# Patient Record
Sex: Male | Born: 1940 | Race: White | Hispanic: No | Smoking: Former smoker
Health system: Southern US, Community
[De-identification: ages and names within clinical notes are randomized; demographics above are authoritative.]

## PROBLEM LIST (undated history)

## (undated) DIAGNOSIS — K449 Diaphragmatic hernia without obstruction or gangrene: Secondary | ICD-10-CM

## (undated) DIAGNOSIS — B019 Varicella without complication: Secondary | ICD-10-CM

## (undated) DIAGNOSIS — K219 Gastro-esophageal reflux disease without esophagitis: Secondary | ICD-10-CM

## (undated) DIAGNOSIS — N400 Enlarged prostate without lower urinary tract symptoms: Secondary | ICD-10-CM

## (undated) HISTORY — DX: Gastro-esophageal reflux disease without esophagitis: K21.9

## (undated) HISTORY — PX: CATARACT EXTRACTION: SUR2

## (undated) HISTORY — DX: Varicella without complication: B01.9

---

## 2005-01-15 ENCOUNTER — Emergency Department (HOSPITAL_COMMUNITY): Admission: EM | Admit: 2005-01-15 | Discharge: 2005-01-15 | Payer: Self-pay | Admitting: Emergency Medicine

## 2007-05-27 ENCOUNTER — Inpatient Hospital Stay (HOSPITAL_COMMUNITY): Admission: EM | Admit: 2007-05-27 | Discharge: 2007-05-31 | Payer: Self-pay | Admitting: Emergency Medicine

## 2007-05-27 ENCOUNTER — Ambulatory Visit: Payer: Self-pay | Admitting: *Deleted

## 2007-06-10 ENCOUNTER — Ambulatory Visit (HOSPITAL_COMMUNITY): Admission: RE | Admit: 2007-06-10 | Discharge: 2007-06-10 | Payer: Self-pay

## 2011-04-28 NOTE — Discharge Summary (Signed)
[name], [name] NO.:  [id number]   MEDICAL RECORD NO.:  [id number]          PATIENT TYPE:  INP   LOCATION:  5507                         FACILITY:  [facility]   PHYSICIAN:  [name], M.D.   DATE OF BIRTH:  12-12-1941   DATE OF ADMISSION:  05/26/2007  DATE OF DISCHARGE:  05/31/2007                               DISCHARGE SUMMARY   DISCHARGE DIAGNOSES:  1. Fever, likely secondary to right lower lobe pneumonia.  2. Right upper lobe cavitary lesion with history of pneumonia in past,      1985, acid-fast bacillus smears x2 negative.  3. Hypothyroidism, newly-diagnosed, TSH 37.05, free T4 0.70.  4. Mild transaminitis, negative ultrasound, negative HIDA scan.      Workup as an outpatient pending.  5. Chronic renal insufficiency.  6. Anemia secondary to B12 deficiency and anemia of chronic disease.  7. Poor dentition.  8. History of superficial thrombophlebitis in 2006.  9. History of pleurisy, recurrent, post history of pneumonia in 1985      with cavitary lesion.   DISCHARGE MEDICATIONS:  1. Folic acid 1 mg p.o. daily.  2. Synthroid 50 mcg p.o. daily.  3. Thiamine 100 mg p.o. daily.  4. Avelox 400 mg p.o. daily for 7 more days.  5. Protonix 40 mg p.o. daily.  6. Vitamin B12 1000 mcg each month IM.   DISPOSITION:  The patient has been discharged home in stable condition  to follow up with his primary care physician, Dr. Loma Larsen at  [facility], phone number [phone].  The patient will be  called with appointment.  At the time of follow-up, need to address the  following issues:   1. His clinical improvement post recent admission secondary to      pneumonia after finishing a complete course of Avelox 400 mg daily      for a total of 12 days.  2. Need to recheck his liver function panel, make sure it is stable or      trending down.  The patient had an ultrasound and HIDA scan, which      were negative during the hospitalization.   An acute hepatitis panel      was negative as well.  3. Need to repeat TSH and free T4 from 4-8 weeks from now since he has      been started on Synthroid therapy.  This is newly-diagnosed.  4. Need to recheck his B12 level.  He has been started on therapy 1000      mcg monthly dosage.  5. Also need to follow up on the right upper lobe cavitary lesion,      which seems to be chronic, secondary to his past history of      pneumonia.  He had AFB smears x2 negative during this      hospitalization.   CONSULTATIONS:  None.   PROCEDURES/IMAGING:  1. He had a CT scan of the chest with contrast on May 27, 2007.  This      showed:  (1) Background pattern of centrilobular emphysema, no  active process otherwise in the left lung.  (2) Pneumonia affecting      the superior segment of the right lower lobe.  (3) Chronic      bronchiectasis, consolidation and collapse affecting the right      upper lobe, 4 cm airspace at the right apex with some fluid      dependent within it, and I think this is probably parenchymal      airspace rather than pleural airspace.  2. Chest x-ray on May 26, 2007, showed chronic right upper lobe      process with cavitation.  Consider TB, atypical TB or fungal or      opportunistic process.  3. Ultrasound of the abdomen on May 28, 2007, showed gallbladder is      not well-distended with slightly prominent gallbladder wall but no      gallstones, no ductal dilatation.  Pancreas obscured by bowel gas.  4. HIDA scan done on May 31, 2007, showed normal exam with normal      gallbladder ejection fraction.   HISTORY OF PRESENT ILLNESS:  [name] is a 70 year old Caucasian man  with past history significant for superficial thrombophlebitis in 2006,  history of pneumonia in 1985 with subsequent cavitary lesion evaluated  by Dr. __________ in [facility], and repeated pleurisies, per patient.  Presented on the day of admission to the ED with a 4-day history of   fever with a T-max of 102 degrees Fahrenheit, diaphoresis, frontal  headache, and jaw pain bilaterally and some chest pain in right lower  lobe and in corresponding back area.  He also mentioned the chest pain  is similar to the pleurisy pain that he used to get frequently post his  pneumonia in 1985.  He also had a PPD placed at that time in 1985, which  was negative.   PHYSICAL EXAMINATION:  VITAL SIGNS:  Temperature 101.9, blood pressure  135/74, pulse 110, respiratory rate 20, O2 saturation 94% on room air.  GENERAL APPEARANCE:  He was in no acute distress.  Family in the room.  HEENT:  Eyes:  Pupils were equal, reactive.  Extraocular muscles intact.  He is anicteric.  No papilledema.  ENT:  Clear oropharynx, moist mucous  membranes, no thrush.  NECK:  Supple, no thyromegaly, no JVD.  No neck stiffness.  RESPIRATIONS:  Clear to auscultation in right lung.  Decreased breath  sounds in the left upper lobe and decreased tactile fremitus in the left  lower lobe.  No crackles.  CARDIOVASCULAR:  Regular rate and rhythm, no murmurs, gallops or rubs.  Good pedal pulses.  ABDOMEN:  Soft, nontender, nondistended.  Positive bowel sounds.  EXTREMITIES:  No edema.  Left leg with tortuous veins.  No increased  warmth or erythema noticed.  BACK:  No CVA tenderness.  SKIN:  Moist, no rashes.  MUSCULOSKELETAL:  Within normal limits.  NEUROLOGIC:  Intact.  Cranial nerves II-XII intact.  Muscle strength 5/5  in all extremities.  Romberg negative.  Normal gait.  Cerebellar signs  within normal limits.   ADMISSION LABORATORY DATA:  CBC with hemoglobin 13.6 and white count  8.3, thrombocytes 190, MCV 87, ANC 63.  BMET revealed sodium 132,  potassium 4.1, chloride 100, bicarb 23, BUN 10, creatinine 1.31, and  glucose 133.  Urinalysis negative.  Blood cultures sent.  Chest x-ray as  mentioned above.   HOSPITAL COURSE:  Problem 1.  FEVER MOST LIKELY SECONDARY TO RIGHT LOWER LOBE PNEUMONIA:  At the  time of admission though with a history of his chronic cavitary  lesion on the right upper lobe which was seen on the chest x-ray, we  were concerned about variable differential diagnosis, including  tuberculosis, neoplasm, versus atypical or fungal infection or anaerobic  infection.  He was placed in isolation and had respiratory cultures,  which were negative.  He also had sputum for AFB smear, which was  negative x3, and culture pending at this time.  CT of the chest with  contrast was done to further assess the right upper lobe cavitary lesion  as well.  This showed chronic changes as mentioned above, including  chronic bronchiectasis, consolidation and collapse affecting the right  upper lobe, 4 cm airspace with some fluid dependent within it, probably  pulmonary parenchymal airspace rather than pleural airspace.  He was  placed on Avelox for empiric therapy of community-acquired pneumonia  over the right upper lobe and over time he clinically improved very much  and remained afebrile throughout the hospitalization.  He was taken out  of the isolation after two AFB smears were negative and the third one  was negative as well, and the culture is pending at this point.  He will  follow for this with his primary care physician.  If his symptoms were  to return in future, he needs more aggressive evaluation, possibly a  bronchoscopy and diagnostic evaluation as well.  Note, he also had a PPD  placed in 1985, which was negative as well.   We also requested the patient to have an HIV test but he decided not to  have the test per his wishes.   Problem 2.  RIGHT UPPER LOBE CHRONIC LUNG LESION:  Most likely this is a  consequence of previous pneumonia and chronic changes.  CT scan of the  chest was done, as mentioned above, which showed chronic changes of  bronchiectasis, consolidation and collapse affecting this area, with a 4  cm airspace at the right apex.  Given the fact that he had AFB  smears x3  negative as well as he improved clinically after treatment of the right  lower lobe pneumonia, we did not go further for evaluating this lesion  with the impression that this is a chronic change; but in case his  symptoms were to return or he develops new symptoms in future, this  needs to be further worked up.   Problem 3.  NEWLY-DIAGNOSED HYPOTHYROIDISM:  He had a TSH checked, which  was low at 37.015, and we did a free T4 level, which was low at 0.70.  He was started on 50 mcg of Synthroid while in hospital and discharged  on same.   Problem 4.  MILD TRANSAMINITIS:  Liver function tests at the time of  admission were slightly elevated with AST of 52, ALT of 44, which  started trending down at one point to AST of 42 but prior to day of  discharge, they did re-trend back up, raising the alkaline phosphatase  level as well to 147, AST up to 64 and ALT up to 65, and further to AST  of 79 and ALT of 78 but remained stable at the time of discharge at AST  of 79 and alkaline phosphatase of 167.  For workup of this in regard to  workup, he had an acute hepatitis panel which was negative and he also  had an ultrasound of the liver, which showed gallbladder not well-  distended with slightly prominent gallbladder  wall, no gallstones, no  ductal dilatation.  With the concern of persistent elevation, we did  check a HIDA scan, which was negative for any gallbladder outflow  obstruction, and the patient did not have any signs of acute  cholecystitis or cholestasis.  He also mentioned being followed up by a  gastroenterologist in the past with a questioned history of hepatitis  and would like to follow up with him as an outpatient.  The patient will  follow with his primary care physician and gastroenterology as an  outpatient as well for further management of this problem as well.  We  also held Tylenol, which was placed for p.r.n. basis, given his abnormal  liver function tests.    Problem 5.  ANEMIA, MULTIFACTORIAL:  He does have signs of iron  deficiency with low iron level of 26, TIBC normal at 259, and percentage  saturation low at 10, but a ferritin level of 415 shows this is probably  unlikely secondary to iron deficiency alone.  He also had B12 deficiency  with a level of 186.  Methylmalonic acid level has been checked.  It is  pending at this time.  He has been started on therapy with B12 and was  given a shot of 1000 mcg IM x1 dose and needs to be placed on monthly  therapy from now and B12 level needs to be checked later on in future to  make sure he is repleted.  His folate level was checked as well, which  was normal at 1075.   His hemoglobin level did remain stable at the time of discharge at 12.3.  From admission, hemoglobin was 12.8 and 13.1.  Anti-intrinsic factor  antibody factor was also checked, which is pending at this time as well.   Problem 6.  CHRONIC RENAL INSUFFICIENCY:  His creatinine level at the  time of admission was 1.42, which remained stable during the  hospitalization, and his discharge creatinine was stable at 1.43.  Again, this needs to be further monitored closely as an outpatient.   DISCHARGE LABORATORY DATA:  CBC with hemoglobin 12.3, white count 6.3,  platelets 256.  CMP with sodium 139, potassium 4.0, chloride 103, bicarb  27, glucose 115, BUN 14, creatinine 1.43.  His AST is 79, ALT 91,  alkaline phosphatase 167.  Total protein 6.7, albumin 2.9, calcium 9.0.   DISCHARGE VITAL SIGNS:  At the time of discharge his vital signs show a  temperature of 97.2, pulse 87, respiratory rate 16, blood pressure  124/74, O2 saturations were 94% on room air.      Judie Grieve, MD  Electronically Signed      [name], M.D.  Electronically Signed    SY/MEDQ  D:  06/01/2007  T:  06/01/2007  Job:  [id number]   cc:   Shawn Sender, MD

## 2011-05-04 ENCOUNTER — Emergency Department (HOSPITAL_COMMUNITY)
Admission: EM | Admit: 2011-05-04 | Discharge: 2011-05-04 | Disposition: A | Discharge status: Home / Self Care | Payer: Medicare Other | Attending: Emergency Medicine | Admitting: Emergency Medicine

## 2011-05-04 DIAGNOSIS — R339 Retention of urine, unspecified: Secondary | ICD-10-CM | POA: Insufficient documentation

## 2011-05-04 LAB — URINALYSIS, ROUTINE W REFLEX MICROSCOPIC
Bilirubin Urine: NEGATIVE
Leukocytes, UA: NEGATIVE
Protein, ur: NEGATIVE mg/dL
Urobilinogen, UA: 0.2 mg/dL (ref 0.0–1.0)

## 2011-05-04 LAB — URINE MICROSCOPIC-ADD ON

## 2011-06-13 ENCOUNTER — Emergency Department (HOSPITAL_COMMUNITY)
Admission: EM | Admit: 2011-06-13 | Discharge: 2011-06-13 | Disposition: A | Discharge status: Home / Self Care | Payer: Medicare Other | Attending: Emergency Medicine | Admitting: Emergency Medicine

## 2011-06-13 DIAGNOSIS — R109 Unspecified abdominal pain: Secondary | ICD-10-CM | POA: Insufficient documentation

## 2011-06-13 DIAGNOSIS — R339 Retention of urine, unspecified: Secondary | ICD-10-CM | POA: Insufficient documentation

## 2011-06-13 DIAGNOSIS — R141 Gas pain: Secondary | ICD-10-CM | POA: Insufficient documentation

## 2011-06-13 DIAGNOSIS — R142 Eructation: Secondary | ICD-10-CM | POA: Insufficient documentation

## 2011-06-13 LAB — URINALYSIS, ROUTINE W REFLEX MICROSCOPIC
Bilirubin Urine: NEGATIVE
Glucose, UA: NEGATIVE mg/dL
Hgb urine dipstick: NEGATIVE
Ketones, ur: NEGATIVE mg/dL
Protein, ur: NEGATIVE mg/dL
pH: 6 (ref 5.0–8.0)

## 2011-06-13 LAB — POCT I-STAT, CHEM 8
BUN: 8 mg/dL (ref 6–23)
Calcium, Ion: 1.23 mmol/L (ref 1.12–1.32)
Creatinine, Ser: 1.4 mg/dL — ABNORMAL HIGH (ref 0.50–1.35)
Glucose, Bld: 105 mg/dL — ABNORMAL HIGH (ref 70–99)
Sodium: 142 mEq/L (ref 135–145)
TCO2: 25 mmol/L (ref 0–100)

## 2011-09-30 LAB — COMPREHENSIVE METABOLIC PANEL
ALT: 65 — ABNORMAL HIGH
AST: 64 — ABNORMAL HIGH
Albumin: 2.9 — ABNORMAL LOW
Albumin: 3.2 — ABNORMAL LOW
Alkaline Phosphatase: 147 — ABNORMAL HIGH
Alkaline Phosphatase: 169 — ABNORMAL HIGH
BUN: 11
BUN: 14
CO2: 25
Calcium: 9
Creatinine, Ser: 1.41
Creatinine, Ser: 1.43
GFR calc Af Amer: >60
Glucose, Bld: 93
Potassium: 3.7
Potassium: 4
Sodium: 138
Total Bilirubin: 1
Total Bilirubin: 1.1
Total Protein: 6.7
Total Protein: 7.3

## 2011-09-30 LAB — CBC
HCT: 35.7 — ABNORMAL LOW
Hemoglobin: 12.3 — ABNORMAL LOW
MCHC: 33.4
MCHC: 33.5
MCHC: 33.7
MCV: 87.8
Platelets: 256
RBC: 4.13 — ABNORMAL LOW
RBC: 4.17 — ABNORMAL LOW
WBC: 6.3

## 2011-09-30 LAB — BASIC METABOLIC PANEL
BUN: 10
Calcium: 8.6
Chloride: 106
Creatinine, Ser: 1.45
GFR calc non Af Amer: 49 — ABNORMAL LOW
Sodium: 140

## 2011-09-30 LAB — RETICULOCYTES
Retic Count, Absolute: 34.2
Retic Ct Pct: 0.8

## 2011-09-30 LAB — AFB CULTURE WITH SMEAR (NOT AT ARMC)

## 2011-09-30 LAB — IRON AND TIBC
Iron: 26 — ABNORMAL LOW
TIBC: 259

## 2011-09-30 LAB — FOLATE: Folate: >20

## 2011-09-30 LAB — FOLATE RBC: RBC Folate: 1075 — ABNORMAL HIGH

## 2011-09-30 LAB — OCCULT BLOOD X 1 CARD TO LAB, STOOL: Fecal Occult Bld: NEGATIVE

## 2011-10-01 LAB — COMPREHENSIVE METABOLIC PANEL
ALT: 41
AST: 42 — ABNORMAL HIGH
Albumin: 3 — ABNORMAL LOW
Alkaline Phosphatase: 87
CO2: 25
Chloride: 107
GFR calc Af Amer: >60
GFR calc non Af Amer: 55 — ABNORMAL LOW
Potassium: 4
Sodium: 137
Total Bilirubin: 1.7 — ABNORMAL HIGH

## 2011-10-01 LAB — URINALYSIS, ROUTINE W REFLEX MICROSCOPIC
Glucose, UA: NEGATIVE
Hgb urine dipstick: NEGATIVE
Specific Gravity, Urine: 1.022
pH: 6.5

## 2011-10-01 LAB — APTT: aPTT: 38 — ABNORMAL HIGH

## 2011-10-01 LAB — CULTURE, BLOOD (ROUTINE X 2)
Culture: NO GROWTH
Culture: NO GROWTH

## 2011-10-01 LAB — URINE MICROSCOPIC-ADD ON

## 2011-10-01 LAB — CULTURE, RESPIRATORY W GRAM STAIN
Culture: NORMAL
Gram Stain: NONE SEEN

## 2011-10-01 LAB — CBC
HCT: 37.8 — ABNORMAL LOW
HCT: 39
Hemoglobin: 12.8 — ABNORMAL LOW
Hemoglobin: 13.1
Hemoglobin: 13.6
MCHC: 33.7
MCV: 87.1
MCV: 87.3
Platelets: 177
RBC: 4.33
RBC: 4.48
RBC: 4.68
RDW: 13.8
RDW: 14.1 — ABNORMAL HIGH
WBC: 6.4
WBC: 6.9

## 2011-10-01 LAB — BASIC METABOLIC PANEL WITH GFR
BUN: 10
CO2: 23
Calcium: 9.2
Chloride: 100
Creatinine, Ser: 1.31
GFR calc Af Amer: >60
GFR calc non Af Amer: 55 — ABNORMAL LOW
Glucose, Bld: 133 — ABNORMAL HIGH
Potassium: 4.1
Sodium: 132 — ABNORMAL LOW

## 2011-10-01 LAB — PROTIME-INR
INR: 1.1
Prothrombin Time: 14.9

## 2011-10-01 LAB — HEPATIC FUNCTION PANEL
ALT: 44
AST: 52 — ABNORMAL HIGH
Alkaline Phosphatase: 90
Indirect Bilirubin: 2.3 — ABNORMAL HIGH
Total Protein: 7.5

## 2011-10-01 LAB — ACID-FAST (MYCOBACTERIA) SMEAR AND CULTURE WITH REFLEX TO IDENTIFICATION: Acid Fast Smear: NONE SEEN

## 2011-10-01 LAB — DIFFERENTIAL
Basophils Absolute: 0
Lymphocytes Relative: 12
Monocytes Absolute: 0.9 — ABNORMAL HIGH
Monocytes Relative: 11
Neutro Abs: 6.3
Neutrophils Relative %: 76

## 2011-10-01 LAB — HEPATITIS PANEL, ACUTE
HCV Ab: NEGATIVE
Hep A IgM: NEGATIVE

## 2011-10-01 LAB — TSH: TSH: 37.015 — ABNORMAL HIGH

## 2011-10-01 LAB — EXPECTORATED SPUTUM ASSESSMENT W GRAM STAIN, RFLX TO RESP C

## 2011-10-01 LAB — BASIC METABOLIC PANEL
Chloride: 103
GFR calc Af Amer: >60
GFR calc non Af Amer: 50 — ABNORMAL LOW
Potassium: 4
Sodium: 139

## 2011-10-01 LAB — AFB CULTURE WITH SMEAR (NOT AT ARMC)

## 2011-10-01 LAB — MAGNESIUM: Magnesium: 2.5

## 2012-12-17 ENCOUNTER — Encounter (HOSPITAL_COMMUNITY): Payer: Self-pay | Admitting: Nurse Practitioner

## 2012-12-17 ENCOUNTER — Emergency Department (HOSPITAL_COMMUNITY)
Admission: EM | Admit: 2012-12-17 | Discharge: 2012-12-18 | Disposition: A | Discharge status: Home / Self Care | Payer: Medicare Other | Attending: Emergency Medicine | Admitting: Emergency Medicine

## 2012-12-17 DIAGNOSIS — R109 Unspecified abdominal pain: Secondary | ICD-10-CM | POA: Insufficient documentation

## 2012-12-17 DIAGNOSIS — R339 Retention of urine, unspecified: Secondary | ICD-10-CM | POA: Insufficient documentation

## 2012-12-17 DIAGNOSIS — Z87891 Personal history of nicotine dependence: Secondary | ICD-10-CM | POA: Insufficient documentation

## 2012-12-17 DIAGNOSIS — N400 Enlarged prostate without lower urinary tract symptoms: Secondary | ICD-10-CM | POA: Insufficient documentation

## 2012-12-17 DIAGNOSIS — R300 Dysuria: Secondary | ICD-10-CM | POA: Insufficient documentation

## 2012-12-17 LAB — URINALYSIS, MICROSCOPIC ONLY
Bilirubin Urine: NEGATIVE
Hgb urine dipstick: NEGATIVE
Nitrite: NEGATIVE
Specific Gravity, Urine: 1.013 (ref 1.005–1.030)
Urobilinogen, UA: 0.2 mg/dL (ref 0.0–1.0)
pH: 5.5 (ref 5.0–8.0)

## 2012-12-17 LAB — BASIC METABOLIC PANEL
Calcium: 9.7 mg/dL (ref 8.4–10.5)
Creatinine, Ser: 1.28 mg/dL (ref 0.50–1.35)
GFR calc Af Amer: 62 mL/min — ABNORMAL LOW (ref >90)
Sodium: 133 mEq/L — ABNORMAL LOW (ref 135–145)

## 2012-12-17 MED ORDER — LIDOCAINE HCL 2 % EX GEL
Freq: Once | CUTANEOUS | Status: DC
  Filled 2012-12-17: qty 20

## 2012-12-17 NOTE — ED Provider Notes (Signed)
History     CSN: [id number]  Arrival date & time 12/17/12  7829   First MD Initiated Contact with Patient 12/17/12 2139      Chief Complaint  Patient presents with  . Urinary Retention    (Consider location/radiation/quality/duration/timing/severity/associated sxs/prior treatment) Patient is a 72 y.o. male presenting with general illness. The history is provided by the patient. No language interpreter was used.  Illness  The current episode started today. The problem occurs continuously. The problem has been unchanged. The problem is moderate. Nothing relieves the symptoms. Nothing aggravates the symptoms. Associated symptoms include abdominal pain. Pertinent negatives include no fever, no constipation, no diarrhea, no nausea, no vomiting, no congestion, no headaches, no sore throat, no cough and no rash.    History reviewed. No pertinent past medical history.  History reviewed. No pertinent past surgical history.  History reviewed. No pertinent family history.  History  Substance Use Topics  . Smoking status: Former Games developer  . Smokeless tobacco: Not on file  . Alcohol Use: No      Review of Systems  Constitutional: Negative for fever and chills.  HENT: Negative for congestion and sore throat.   Respiratory: Negative for cough and shortness of breath.   Cardiovascular: Negative for chest pain and leg swelling.  Gastrointestinal: Positive for abdominal pain. Negative for nausea, vomiting, diarrhea and constipation.  Genitourinary: Positive for decreased urine volume and difficulty urinating. Negative for dysuria and frequency.  Skin: Negative for color change and rash.  Neurological: Negative for dizziness and headaches.  Psychiatric/Behavioral: Negative for confusion and agitation.  All other systems reviewed and are negative.    Allergies  Review of patient's allergies indicates no known allergies.  Home Medications  No current outpatient prescriptions on  file.  BP 143/79  Pulse 115  Temp 98 F (36.7 C) (Oral)  Resp 24  SpO2 98%  Physical Exam  Constitutional: He is oriented to person, place, and time. He appears well-developed and well-nourished. No distress.  HENT:  Head: Normocephalic and atraumatic.  Eyes: EOM are normal. Pupils are equal, round, and reactive to light.  Neck: Normal range of motion. Neck supple.  Cardiovascular: Normal rate and regular rhythm.   Pulmonary/Chest: Effort normal. No respiratory distress.  Abdominal: Soft. He exhibits distension. There is tenderness in the suprapubic area. There is no rigidity, no rebound, no guarding, no CVA tenderness, no tenderness at McBurney's point and negative Murphy's sign. A hernia is present. Hernia confirmed positive in the ventral area.    Genitourinary: Prostate is enlarged. Prostate is not tender.  Musculoskeletal: Normal range of motion. He exhibits no edema.  Neurological: He is alert and oriented to person, place, and time.  Skin: Skin is warm and dry.  Psychiatric: He has a normal mood and affect. His behavior is normal.    ED Course  Procedures (including critical care time)  Labs Reviewed - No data to display No results found. Results for orders placed during the hospital encounter of 12/17/12  URINALYSIS, MICROSCOPIC ONLY      Component Value Range   Color, Urine YELLOW  YELLOW   APPearance CLEAR  CLEAR   Specific Gravity, Urine 1.013  1.005 - 1.030   pH 5.5  5.0 - 8.0   Glucose, UA NEGATIVE  NEGATIVE mg/dL   Hgb urine dipstick NEGATIVE  NEGATIVE   Bilirubin Urine NEGATIVE  NEGATIVE   Ketones, ur NEGATIVE  NEGATIVE mg/dL   Protein, ur NEGATIVE  NEGATIVE mg/dL   Urobilinogen, UA 0.2  0.0 - 1.0 mg/dL   Nitrite NEGATIVE  NEGATIVE   Leukocytes, UA NEGATIVE  NEGATIVE   WBC, UA 0-2  <3 WBC/hpf   RBC / HPF 0-2  <3 RBC/hpf  BASIC METABOLIC PANEL      Component Value Range   Sodium 133 (*) 135 - 145 mEq/L   Potassium 3.9  3.5 - 5.1 mEq/L   Chloride 97   96 - 112 mEq/L   CO2 23  19 - 32 mEq/L   Glucose, Bld 102 (*) 70 - 99 mg/dL   BUN 12  6 - 23 mg/dL   Creatinine, Ser 4.09  0.50 - 1.35 mg/dL   Calcium 9.7  8.4 - 81.1 mg/dL   GFR calc non Af Amer 54 (*) >90 mL/min   GFR calc Af Amer 62 (*) >90 mL/min  GRAM STAIN      Component Value Range   Specimen Description URINE, CATHETERIZED     Special Requests NONE     Gram Stain       Value: CYTOSPUN URINE     NO ORGANISMS SEEN     NO WBC SEEN   Report Status 12/18/2012 FINAL       No diagnosis found.    MDM  Pt w/ Hx of BPH presents to ED for acute urinary retention. Has not urinated since 0900 today. C/o abdominal pain and distension. Denies fever or flank pain. No hematuria. Has been seen by urology in the past, is not currently on meds for BPH  Exam: vitals unremarkable. abd distended, bladder palpable at level of umbilicus. Ttp, prostate grossly enlarged, nttp.   Plan: likely acute obstructive urinary retention 2/2 BPH. Will insert foley cath, check u/a, bmp  Course: reassessed, vitals stable, foley placed w/out event, u/a neg for infection, Cr 1.28. No ARF or infection at this time. Stable for d/c home. Given leg bag and instructions to follow up w/ urology on Monday. Given return precautions. D/c in good condition.  1. Urinary retention   2. BPH (benign prostatic hyperplasia)    [facility] [address] [place] [address] [phone] Schedule an appointment as soon as possible for a visit on 12/19/2012         Audelia Hives, MD 12/18/12 [phone]

## 2012-12-17 NOTE — ED Notes (Signed)
Pt states unable to void since 0900 today, when he was able to void a small amount. Reports he has been attempting to void but "i only get 1 or 2 drops out."

## 2012-12-17 NOTE — ED Provider Notes (Addendum)
Patient unable to urinate since morning. Feels much improved since treatment with Foley catheter in the emergency department. Patient to go home with indwelling Foley catheter  Doug Sou, MD 12/17/12 2226  Doug Sou, MD 12/18/12 1610

## 2012-12-17 NOTE — ED Notes (Signed)
Pt seen by EDP, prior to RN assessment, see MD notes.

## 2012-12-18 LAB — GRAM STAIN

## 2012-12-18 NOTE — ED Provider Notes (Signed)
I have personally seen and examined the patient.  I have discussed the plan of care with the resident.  I have reviewed the documentation on PMH/FH/Soc. History.  I have reviewed the documentation of the resident and agree.  Doug Sou, MD 12/18/12 [phone]

## 2014-06-30 ENCOUNTER — Encounter (HOSPITAL_COMMUNITY): Payer: Self-pay | Admitting: Emergency Medicine

## 2014-06-30 ENCOUNTER — Emergency Department (HOSPITAL_COMMUNITY)
Admission: EM | Admit: 2014-06-30 | Discharge: 2014-06-30 | Disposition: A | Discharge status: Home / Self Care | Payer: Medicare Other | Attending: Emergency Medicine | Admitting: Emergency Medicine

## 2014-06-30 DIAGNOSIS — R34 Anuria and oliguria: Secondary | ICD-10-CM | POA: Insufficient documentation

## 2014-06-30 DIAGNOSIS — R143 Flatulence: Secondary | ICD-10-CM | POA: Diagnosis not present

## 2014-06-30 DIAGNOSIS — R339 Retention of urine, unspecified: Secondary | ICD-10-CM | POA: Diagnosis present

## 2014-06-30 DIAGNOSIS — R1033 Periumbilical pain: Secondary | ICD-10-CM | POA: Insufficient documentation

## 2014-06-30 DIAGNOSIS — K6289 Other specified diseases of anus and rectum: Secondary | ICD-10-CM | POA: Insufficient documentation

## 2014-06-30 DIAGNOSIS — R141 Gas pain: Secondary | ICD-10-CM | POA: Diagnosis not present

## 2014-06-30 DIAGNOSIS — R300 Dysuria: Secondary | ICD-10-CM | POA: Diagnosis not present

## 2014-06-30 DIAGNOSIS — R3915 Urgency of urination: Secondary | ICD-10-CM | POA: Insufficient documentation

## 2014-06-30 DIAGNOSIS — R142 Eructation: Secondary | ICD-10-CM

## 2014-06-30 HISTORY — DX: Benign prostatic hyperplasia without lower urinary tract symptoms: N40.0

## 2014-06-30 LAB — COMPREHENSIVE METABOLIC PANEL
ALK PHOS: 70 U/L (ref 39–117)
ALT: 13 U/L (ref 0–53)
ANION GAP: 19 — AB (ref 5–15)
AST: 20 U/L (ref 0–37)
Albumin: 4.1 g/dL (ref 3.5–5.2)
BILIRUBIN TOTAL: 1.1 mg/dL (ref 0.3–1.2)
BUN: 11 mg/dL (ref 6–23)
CHLORIDE: 98 meq/L (ref 96–112)
CO2: 21 mEq/L (ref 19–32)
Calcium: 9.2 mg/dL (ref 8.4–10.5)
Creatinine, Ser: 1.41 mg/dL — ABNORMAL HIGH (ref 0.50–1.35)
GFR calc non Af Amer: 47 mL/min — ABNORMAL LOW (ref >90)
GFR, EST AFRICAN AMERICAN: 55 mL/min — AB (ref >90)
GLUCOSE: 131 mg/dL — AB (ref 70–99)
POTASSIUM: 4.1 meq/L (ref 3.7–5.3)
SODIUM: 138 meq/L (ref 137–147)
Total Protein: 7.4 g/dL (ref 6.0–8.3)

## 2014-06-30 LAB — URINALYSIS, ROUTINE W REFLEX MICROSCOPIC
BILIRUBIN URINE: NEGATIVE
Glucose, UA: NEGATIVE mg/dL
KETONES UR: NEGATIVE mg/dL
Leukocytes, UA: NEGATIVE
NITRITE: NEGATIVE
PH: 7 (ref 5.0–8.0)
Protein, ur: 30 mg/dL — AB
Specific Gravity, Urine: 1.006 (ref 1.005–1.030)
UROBILINOGEN UA: 0.2 mg/dL (ref 0.0–1.0)

## 2014-06-30 LAB — URINE MICROSCOPIC-ADD ON

## 2014-06-30 LAB — CBC WITH DIFFERENTIAL/PLATELET
Basophils Absolute: 0 10*3/uL (ref 0.0–0.1)
Basophils Relative: 0 % (ref 0–1)
Eosinophils Absolute: 0 10*3/uL (ref 0.0–0.7)
Eosinophils Relative: 0 % (ref 0–5)
HCT: 45.7 % (ref 39.0–52.0)
HEMOGLOBIN: 14.9 g/dL (ref 13.0–17.0)
LYMPHS ABS: 0.5 10*3/uL — AB (ref 0.7–4.0)
LYMPHS PCT: 6 % — AB (ref 12–46)
MCH: 28.8 pg (ref 26.0–34.0)
MCHC: 32.6 g/dL (ref 30.0–36.0)
MCV: 88.4 fL (ref 78.0–100.0)
MONOS PCT: 7 % (ref 3–12)
Monocytes Absolute: 0.5 10*3/uL (ref 0.1–1.0)
NEUTROS ABS: 6.9 10*3/uL (ref 1.7–7.7)
NEUTROS PCT: 87 % — AB (ref 43–77)
Platelets: 132 10*3/uL — ABNORMAL LOW (ref 150–400)
RBC: 5.17 MIL/uL (ref 4.22–5.81)
RDW: 14.2 % (ref 11.5–15.5)
WBC: 7.9 10*3/uL (ref 4.0–10.5)

## 2014-06-30 MED ORDER — MORPHINE SULFATE 4 MG/ML IJ SOLN
4.0000 mg | Freq: Once | INTRAMUSCULAR | Status: AC
  Administered 2014-06-30: 4 mg via INTRAVENOUS
  Filled 2014-06-30: qty 1

## 2014-06-30 MED ORDER — TAMSULOSIN HCL 0.4 MG PO CAPS
0.4000 mg | ORAL_CAPSULE | Freq: Once | ORAL | Status: AC
  Administered 2014-06-30: 0.4 mg via ORAL
  Filled 2014-06-30: qty 1

## 2014-06-30 MED ORDER — TAMSULOSIN HCL 0.4 MG PO CAPS: 0.4000 mg | ORAL_CAPSULE | Freq: Every day | ORAL | Status: DC

## 2014-06-30 NOTE — Discharge Instructions (Signed)
Acute Urinary Retention  Acute urinary retention is when you are unable to pee (urinate). Acute urinary retention is common in older men. Prostates can get bigger, which blocks the flow of pee.   HOME CARE  · Drink enough fluids to keep your pee clear or pale yellow.  · If you are sent home with a tube that drains the bladder (catheter), there will be a drainage bag attached to it. There are two types of bags. One is big that you can wear at night without having to empty it. One is smaller and needs to be emptied more often.  ¨ Keep the drainage bag empty.  ¨ Keep the drainage bag lower than your catheter.  · Only take medicine as told by your doctor.  GET HELP IF:  · You have a low-grade fever.  · You have spasms or you are leaking pee when you have spasms.  GET HELP RIGHT AWAY IF:   · You have chills or a fever.  · Your catheter stops draining pee.  · Your catheter falls out.  · You have increased bleeding that does not stop after you have rested and increased the amount of fluids you had been drinking.  MAKE SURE YOU:   · Understand these instructions.  · Will watch your condition.  · Will get help right away if you are not doing well or get worse.  Document Released: 05/18/2008 Document Revised: 09/20/2013 Document Reviewed: 05/11/2013  [employer]® Patient Information ©2015 [employer]. This information is not intended to replace advice given to you by your health care provider. Make sure you discuss any questions you have with your health care provider.

## 2014-06-30 NOTE — ED Provider Notes (Signed)
CSN: [id number]     Arrival date & time 06/30/14  1324 History   First MD Initiated Contact with Patient 06/30/14 1332     Chief Complaint  Patient presents with  . Urinary Retention     (Consider location/radiation/quality/duration/timing/severity/associated sxs/prior Treatment) HPI Comments: [name] is a 73 y.o. Male with a PMHx of BPH and urinary retention presenting today with urinary retention since 0300. Pt states he awoke and had the urge to urinate, but has been unsuccessful. States he's developed suprapubic fullness and pressure, 10/10, constant, radiating into his perineum/rectum, with no known aggravating or alleviating factors, with associated abd distension. States this has occurred in the past and he had a catheter placed, which relieved his pain. Denies HA, vision changes, fevers/chills, CP, SOB, upper abd pain, N/V/D/C, back pain, flank pain, hematuria, dysuria, penile discharge or swelling, testicular pain or swelling. States he's been taking Flomax for 1 year and has not had any issues with it. No recent diet changes or medication changes.  Patient is a 73 y.o. male presenting with abdominal pain. The history is provided by the patient. No language interpreter was used.  Abdominal Pain Pain location:  Suprapubic Pain quality: fullness and pressure   Pain radiates to:  Perineum Pain severity:  Severe Onset quality:  Sudden Duration:  11 hours Timing:  Constant Progression:  Worsening Chronicity:  New Context: awakening from sleep   Relieved by:  Nothing Worsened by:  Nothing tried Ineffective treatments:  None tried Associated symptoms: no chest pain, no chills, no constipation, no cough, no diarrhea, no dysuria, no fatigue, no fever, no flatus, no hematemesis, no hematochezia, no hematuria, no melena, no nausea, no shortness of breath and no vomiting   Risk factors: being elderly     Past Medical History  Diagnosis Date  . Prostate enlargement    History  reviewed. No pertinent past surgical history. No family history on file. History  Substance Use Topics  . Smoking status: Former Games developer  . Smokeless tobacco: Not on file  . Alcohol Use: No    Review of Systems  Constitutional: Negative for fever, chills and fatigue.  Eyes: Negative for visual disturbance.  Respiratory: Negative for cough and shortness of breath.   Cardiovascular: Negative for chest pain and leg swelling.  Gastrointestinal: Positive for abdominal pain, abdominal distention and rectal pain (fullness). Negative for nausea, vomiting, diarrhea, constipation, blood in stool, melena, hematochezia, anal bleeding, flatus and hematemesis.  Genitourinary: Positive for decreased urine volume and difficulty urinating. Negative for dysuria, urgency, frequency, hematuria, flank pain, discharge, penile swelling, scrotal swelling, penile pain and testicular pain.  Musculoskeletal: Negative for back pain, myalgias, neck pain and neck stiffness.  Skin: Negative for rash.  Neurological: Negative for dizziness, syncope, weakness and light-headedness.  Psychiatric/Behavioral: Negative for confusion.  10 Systems reviewed and are negative for acute change except as noted in the HPI.     Allergies  Review of patient's allergies indicates no known allergies.  Home Medications   Prior to Admission medications   Medication Sig Start Date End Date Taking? Authorizing Provider  tamsulosin (FLOMAX) 0.4 MG CAPS capsule Take 0.4 mg by mouth daily. 05/28/14  Yes Historical Provider, MD   BP 171/106  Pulse 91  Temp(Src) 97.8 F (36.6 C)  Resp 20  Ht 6\' 1"  (1.854 m)  Wt 210 lb (95.255 kg)  BMI 27.71 kg/m2  SpO2 96% Physical Exam  Nursing note and vitals reviewed. Constitutional: He is oriented to person, place, and  time. He appears well-developed and well-nourished. He appears distressed.  Hypertensive at 170s/100s, visibly distressed and uncomfortable. Non-toxic, afebrile  HENT:  Head:  Normocephalic and atraumatic.  Mouth/Throat: Oropharynx is clear and moist. Mucous membranes are dry.  Mucous membranes dry  Eyes: Conjunctivae and EOM are normal. Pupils are equal, round, and reactive to light. Right eye exhibits no discharge. Left eye exhibits no discharge.  Neck: Normal range of motion. Neck supple.  Cardiovascular: Normal rate, regular rhythm, normal heart sounds and intact distal pulses.  Exam reveals no gallop.   No murmur heard. Pulmonary/Chest: Effort normal and breath sounds normal. No respiratory distress. He has no decreased breath sounds. He has no wheezes. He has no rales.  Abdominal: Soft. Bowel sounds are normal. He exhibits distension. He exhibits no fluid wave. There is tenderness in the periumbilical area and suprapubic area. There is no rigidity, no rebound, no guarding and no CVA tenderness. Hernia confirmed negative in the right inguinal area and confirmed negative in the left inguinal area.  Soft, +BS throughout. TTP in suprapubic region up to periumbilical region, with moderate distension noted suprapubically. Dullness to percussion over distension with no tympany in other quadrants. No rebound, guarding, rigidity, no CVA TTP.   Genitourinary: Testes normal and penis normal. Cremasteric reflex is present.  Circumcised penis with red blood at external urethral meatus, which began after two cath attempts while here, prior to exam. Testes WNL with no TTP, neg Prehn's, +cremasteric reflex bilaterally, no high-riding, and no swelling. B/L inguinal canals free of hernias  Musculoskeletal: Normal range of motion.  Neurological: He is alert and oriented to person, place, and time.  Skin: Skin is warm, dry and intact. No rash noted.  Psychiatric: He has a normal mood and affect.    ED Course  BLADDER CATHETERIZATION Date/Time: 06/30/2014 3:30 PM Performed by: CAMPRUBI-SOMS, Jorryn Hershberger STRUPP Authorized by: Ramond MarrowAMPRUBI-SOMS, Jandiel Magallanes STRUPP Consent: Verbal consent  obtained. Risks and benefits: risks, benefits and alternatives were discussed Consent given by: patient Patient understanding: patient states understanding of the procedure being performed Patient consent: the patient's understanding of the procedure matches consent given Patient identity confirmed: verbally with patient Indications: urinary retention Local anesthesia used: no Patient sedated: no Preparation: Patient was prepped and draped in the usual sterile fashion. Catheter type: coude Catheter size: 16 Fr Complicated insertion: yes Altered anatomy: no Bladder irrigation: yes Number of attempts: 4 (3 by nursing, 1 by me) Urine volume: 0 ml Comments: Pt has been catheterized three times by nursing, causing bright red blood oozing at the urethral meatus. Nursing flushed the cath, and a coude cath was attempted by me. It was placed without resistance, but no urine returned. Left in place, and continued to have some bright red blood oozing around catheter.    (including critical care time) Labs Review Labs Reviewed  CBC WITH DIFFERENTIAL  COMPREHENSIVE METABOLIC PANEL  URINALYSIS, ROUTINE W REFLEX MICROSCOPIC    Imaging Review No results found.   EKG Interpretation None      MDM   Final diagnoses:  None    Kimberlee NearingKenneth A Larsen is a 73 y.o. male with a PMHx of urinary retention due to BPH who presents with 13hrs of urinary retention. Bladder scan revealing >999 mL, pt obviously distended. Abd exam without peritoneal signs. Three caths were attempted by nursing, and were unsuccessful, leading to urethral bleeding. Cath flushed prior to removal. Pt given morphine with some relief of pain and improvement of HTN, and then coude cath with 16Fr was  attempted by me. Cath inserted with no resistance, but no urine returned. Consulted urology at that time. Labs sent and pending. Doubt prostatitis given lack of systemic symptoms and sudden onset with bladder scan revealing obvious retained  urine. Signed over to Dr. Juleen China, please see his dictation for further documentation of care regarding this patient.   Pt with persisting hypertension. Again, please see Dr. Marylen Ponto dictation for further documentation.     [place], [place] 06/30/14 1656

## 2014-06-30 NOTE — ED Provider Notes (Signed)
Medical screening examination/treatment/procedure(s) were conducted as a shared visit with non-physician practitioner(s) and myself.  I personally evaluated the patient during the encounter.   EKG Interpretation None     Pt here with urinary retention, staff unable to pass foley, will consult urology  Toy BakerAnthony T Aiana Nordquist, MD 06/30/14 1600

## 2014-06-30 NOTE — Consult Note (Signed)
Reason for Consult: Urinary Retention, Difficult Foley  Referring Physician: Eugenio Hoes MD  [name] is an 73 y.o. male.   HPI:   1 - Urinary Retention, Enlarged Prostate, Difficult Foley - pt with second episode urinary retention. Last void 3AM today. Now with pain, palpably full bladder.   Had prior episode 2014 and placed on tamsulosin and ER foley placement and passed subsequent trial of void with minimal PVR of 75m.    Today [name] seen as emergent consult for above. He c/o 10/10 sp pain. Attempt at ER catheter placement unsuccessful, met with resistance and then blood. No recent fevers. He is very active at baseline and still does some lawnmower repair and takes care of a large garden, yard. No baseline limiting CV disease or blood thinners.  Past Medical History  Diagnosis Date  . Prostate enlargement     History reviewed. No pertinent past surgical history.  No family history on file.  Social History:  reports that he has quit smoking. He does not have any smokeless tobacco history on file. He reports that he does not drink alcohol or use illicit drugs.  Allergies: No Known Allergies  Medications: I have reviewed the patient's current medications.  Results for orders placed during the hospital encounter of 06/30/14 (from the past 48 hour(s))  CBC WITH DIFFERENTIAL     Status: Abnormal   Collection Time    06/30/14  3:45 PM      Result Value Ref Range   WBC 7.9  4.0 - 10.5 K/uL   RBC 5.17  4.22 - 5.81 MIL/uL   Hemoglobin 14.9  13.0 - 17.0 g/dL   HCT 45.7  39.0 - 52.0 %   MCV 88.4  78.0 - 100.0 fL   MCH 28.8  26.0 - 34.0 pg   MCHC 32.6  30.0 - 36.0 g/dL   RDW 14.2  11.5 - 15.5 %   Platelets 132 (*) 150 - 400 K/uL   Neutrophils Relative % 87 (*) 43 - 77 %   Neutro Abs 6.9  1.7 - 7.7 K/uL   Lymphocytes Relative 6 (*) 12 - 46 %   Lymphs Abs 0.5 (*) 0.7 - 4.0 K/uL   Monocytes Relative 7  3 - 12 %   Monocytes Absolute 0.5  0.1 - 1.0 K/uL   Eosinophils Relative  0  0 - 5 %   Eosinophils Absolute 0.0  0.0 - 0.7 K/uL   Basophils Relative 0  0 - 1 %   Basophils Absolute 0.0  0.0 - 0.1 K/uL  COMPREHENSIVE METABOLIC PANEL     Status: Abnormal   Collection Time    06/30/14  3:45 PM      Result Value Ref Range   Sodium 138  137 - 147 mEq/L   Potassium 4.1  3.7 - 5.3 mEq/L   Chloride 98  96 - 112 mEq/L   CO2 21  19 - 32 mEq/L   Glucose, Bld 131 (*) 70 - 99 mg/dL   BUN 11  6 - 23 mg/dL   Creatinine, Ser 1.41 (*) 0.50 - 1.35 mg/dL   Calcium 9.2  8.4 - 10.5 mg/dL   Total Protein 7.4  6.0 - 8.3 g/dL   Albumin 4.1  3.5 - 5.2 g/dL   AST 20  0 - 37 U/L   ALT 13  0 - 53 U/L   Alkaline Phosphatase 70  39 - 117 U/L   Total Bilirubin 1.1  0.3 - 1.2  mg/dL   GFR calc non Af Amer 47 (*) >90 mL/min   GFR calc Af Amer 55 (*) >90 mL/min   Comment: (NOTE)     The eGFR has been calculated using the CKD EPI equation.     This calculation has not been validated in all clinical situations.     eGFR's persistently <90 mL/min signify possible Chronic Kidney     Disease.   Anion gap 19 (*) 5 - 15    No results found.  Review of Systems  Constitutional: Negative.  Negative for fever, chills, weight loss and malaise/fatigue.  HENT: Negative.   Eyes: Negative.   Respiratory: Negative.   Cardiovascular: Negative.   Gastrointestinal: Positive for abdominal pain. Negative for nausea and vomiting.  Genitourinary: Positive for urgency.       Severe 10/10 sp pain  Musculoskeletal: Negative.   Skin: Negative.   Neurological: Negative.   Endo/Heme/Allergies: Negative.   Psychiatric/Behavioral: Negative.    Blood pressure 171/106, pulse 91, temperature 97.8 F (36.6 C), resp. rate 20, height 6' 1"  (1.854 m), weight 95.255 kg (210 lb), SpO2 96.00%. Physical Exam  Constitutional: He appears well-developed and well-nourished.  Wife at bedside  HENT:  Head: Normocephalic and atraumatic.  Eyes: Pupils are equal, round, and reactive to light.  Neck: Normal range of  motion. Neck supple.  Cardiovascular: Normal rate.   Respiratory: Effort normal.  GI: Soft. Bowel sounds are normal.  Genitourinary: Penis normal.  Blood at meatus with foley in place not draining c/w likekly malposition. SP TTP.  Musculoskeletal: Normal range of motion.  Neurological: He is alert.  Skin: Skin is warm and dry.  Psychiatric: He has a normal mood and affect. His behavior is normal. Judgment and thought content normal.   PROCEDURE:  Given blood at meatus, retention, decision made to proceed with bedisde cysto / catheter placement. Current foley removed and formed clot per urethra. Penis prepped with betadine and flexible cystourethroscopy performed with 16G flexible cystoscope. Large bilobar prostatic hypertrophy with some bulbar area catheter trauma w/o urethral disruption. Bladder full of clear urine, no masses. Sensor wire placed over which new 75F council placed, 10cc water in balloon.  Immediate efflux 1500 clear urine followed by some light bloody urine. Bladder irrigated with 400 cc NS to completely clear.   Assessment/Plan:  1 - Urinary Retention, Enlarged Prostate, Difficult Foley - 75F foley placed with cystoscopic guidance, likely due to refractory BPH, now failed alpha blockers. Pt may DC with current catheter, we will arrange GU follow up for further discussion.  Likely benefit from long term from adding finasteride or proceeding with TURP as now refractory retention, prostatic hypertrophy, and preserved PVR.    [name] 06/30/2014, 5:24 PM

## 2014-06-30 NOTE — ED Notes (Signed)
Multiple attempts for foley catheter that were unsuccessful. ED PA attempt coude catheter and unsuccessful as well.

## 2014-06-30 NOTE — ED Notes (Signed)
Pt c/o urinary rentention since 0300 this morning. Pt stated that he has had this happen in the past and that his prostate is enlarged. Pt is very uncomfortable.

## 2014-07-01 NOTE — ED Provider Notes (Signed)
Medical screening examination/treatment/procedure(s) were conducted as a shared visit with non-physician practitioner(s) and myself.  I personally evaluated the patient during the encounter.   EKG Interpretation None       Toy BakerAnthony T Debra Calabretta, MD 07/01/14 [phone]

## 2016-05-10 ENCOUNTER — Emergency Department (HOSPITAL_COMMUNITY): Payer: Medicare Other

## 2016-05-10 ENCOUNTER — Encounter (HOSPITAL_COMMUNITY): Payer: Self-pay | Admitting: *Deleted

## 2016-05-10 ENCOUNTER — Inpatient Hospital Stay (HOSPITAL_COMMUNITY)
Admission: EM | Admit: 2016-05-10 | Discharge: 2016-05-13 | DRG: 871 | Disposition: A | Discharge status: Home / Self Care | Payer: Medicare Other | Attending: Internal Medicine | Admitting: Internal Medicine

## 2016-05-10 DIAGNOSIS — I480 Paroxysmal atrial fibrillation: Secondary | ICD-10-CM | POA: Diagnosis present

## 2016-05-10 DIAGNOSIS — R9431 Abnormal electrocardiogram [ECG] [EKG]: Secondary | ICD-10-CM | POA: Diagnosis not present

## 2016-05-10 DIAGNOSIS — N182 Chronic kidney disease, stage 2 (mild): Secondary | ICD-10-CM | POA: Diagnosis present

## 2016-05-10 DIAGNOSIS — N400 Enlarged prostate without lower urinary tract symptoms: Secondary | ICD-10-CM | POA: Diagnosis not present

## 2016-05-10 DIAGNOSIS — N179 Acute kidney failure, unspecified: Secondary | ICD-10-CM | POA: Diagnosis not present

## 2016-05-10 DIAGNOSIS — N390 Urinary tract infection, site not specified: Secondary | ICD-10-CM

## 2016-05-10 DIAGNOSIS — A419 Sepsis, unspecified organism: Secondary | ICD-10-CM | POA: Diagnosis not present

## 2016-05-10 DIAGNOSIS — N3001 Acute cystitis with hematuria: Secondary | ICD-10-CM

## 2016-05-10 DIAGNOSIS — R739 Hyperglycemia, unspecified: Secondary | ICD-10-CM | POA: Diagnosis present

## 2016-05-10 DIAGNOSIS — J440 Chronic obstructive pulmonary disease with acute lower respiratory infection: Secondary | ICD-10-CM | POA: Diagnosis present

## 2016-05-10 DIAGNOSIS — N289 Disorder of kidney and ureter, unspecified: Secondary | ICD-10-CM

## 2016-05-10 DIAGNOSIS — K219 Gastro-esophageal reflux disease without esophagitis: Secondary | ICD-10-CM | POA: Diagnosis present

## 2016-05-10 DIAGNOSIS — J189 Pneumonia, unspecified organism: Secondary | ICD-10-CM | POA: Diagnosis present

## 2016-05-10 DIAGNOSIS — I5032 Chronic diastolic (congestive) heart failure: Secondary | ICD-10-CM | POA: Diagnosis present

## 2016-05-10 DIAGNOSIS — B9620 Unspecified Escherichia coli [E. coli] as the cause of diseases classified elsewhere: Secondary | ICD-10-CM | POA: Diagnosis present

## 2016-05-10 DIAGNOSIS — Z87891 Personal history of nicotine dependence: Secondary | ICD-10-CM

## 2016-05-10 LAB — CBC WITH DIFFERENTIAL/PLATELET
BASOS PCT: 0 %
Basophils Absolute: 0 10*3/uL (ref 0.0–0.1)
EOS ABS: 0 10*3/uL (ref 0.0–0.7)
Eosinophils Relative: 1 %
HEMATOCRIT: 40.2 % (ref 39.0–52.0)
HEMOGLOBIN: 13.1 g/dL (ref 13.0–17.0)
LYMPHS ABS: 1 10*3/uL (ref 0.7–4.0)
Lymphocytes Relative: 13 %
MCH: 28.8 pg (ref 26.0–34.0)
MCHC: 32.6 g/dL (ref 30.0–36.0)
MCV: 88.4 fL (ref 78.0–100.0)
MONOS PCT: 13 %
Monocytes Absolute: 1 10*3/uL (ref 0.1–1.0)
NEUTROS ABS: 5.5 10*3/uL (ref 1.7–7.7)
NEUTROS PCT: 73 %
Platelets: 168 10*3/uL (ref 150–400)
RBC: 4.55 MIL/uL (ref 4.22–5.81)
RDW: 14.3 % (ref 11.5–15.5)
WBC: 7.5 10*3/uL (ref 4.0–10.5)

## 2016-05-10 LAB — COMPREHENSIVE METABOLIC PANEL
ALBUMIN: 3.2 g/dL — AB (ref 3.5–5.0)
ALK PHOS: 127 U/L — AB (ref 38–126)
ALT: 60 U/L (ref 17–63)
AST: 74 U/L — ABNORMAL HIGH (ref 15–41)
Anion gap: 11 (ref 5–15)
BILIRUBIN TOTAL: 2.2 mg/dL — AB (ref 0.3–1.2)
BUN: 35 mg/dL — ABNORMAL HIGH (ref 6–20)
CALCIUM: 8.8 mg/dL — AB (ref 8.9–10.3)
CO2: 22 mmol/L (ref 22–32)
CREATININE: 2.69 mg/dL — AB (ref 0.61–1.24)
Chloride: 103 mmol/L (ref 101–111)
GFR calc Af Amer: 25 mL/min — ABNORMAL LOW (ref >60)
GFR calc non Af Amer: 21 mL/min — ABNORMAL LOW (ref >60)
GLUCOSE: 237 mg/dL — AB (ref 65–99)
Potassium: 3.7 mmol/L (ref 3.5–5.1)
SODIUM: 136 mmol/L (ref 135–145)
TOTAL PROTEIN: 7.3 g/dL (ref 6.5–8.1)

## 2016-05-10 LAB — URINALYSIS, ROUTINE W REFLEX MICROSCOPIC
GLUCOSE, UA: NEGATIVE mg/dL
Ketones, ur: 15 mg/dL — AB
Nitrite: POSITIVE — AB
PH: 5.5 (ref 5.0–8.0)
Protein, ur: 30 mg/dL — AB
SPECIFIC GRAVITY, URINE: 1.025 (ref 1.005–1.030)

## 2016-05-10 LAB — I-STAT TROPONIN, ED: Troponin i, poc: 0.06 ng/mL (ref 0.00–0.08)

## 2016-05-10 LAB — URINE MICROSCOPIC-ADD ON

## 2016-05-10 MED ORDER — DEXTROSE 5 % IV SOLN
500.0000 mg | Freq: Once | INTRAVENOUS | Status: AC
  Administered 2016-05-11: 500 mg via INTRAVENOUS
  Filled 2016-05-10: qty 500

## 2016-05-10 MED ORDER — IPRATROPIUM-ALBUTEROL 0.5-2.5 (3) MG/3ML IN SOLN
3.0000 mL | Freq: Three times a day (TID) | RESPIRATORY_TRACT | Status: DC
  Administered 2016-05-11 – 2016-05-13 (×7): 3 mL via RESPIRATORY_TRACT
  Filled 2016-05-10 (×6): qty 3

## 2016-05-10 MED ORDER — SODIUM CHLORIDE 0.9 % IV SOLN
INTRAVENOUS | Status: DC
Start: 2016-05-10 — End: 2016-05-11
  Administered 2016-05-11 (×2): via INTRAVENOUS

## 2016-05-10 MED ORDER — SODIUM CHLORIDE 0.9 % IV BOLUS (SEPSIS)
1000.0000 mL | Freq: Once | INTRAVENOUS | Status: AC
  Administered 2016-05-11: 1000 mL via INTRAVENOUS

## 2016-05-10 MED ORDER — IPRATROPIUM-ALBUTEROL 0.5-2.5 (3) MG/3ML IN SOLN
3.0000 mL | Freq: Once | RESPIRATORY_TRACT | Status: AC
  Administered 2016-05-10: 3 mL via RESPIRATORY_TRACT
  Filled 2016-05-10: qty 3

## 2016-05-10 MED ORDER — DEXTROSE 5 % IV SOLN
1.0000 g | Freq: Once | INTRAVENOUS | Status: AC
  Administered 2016-05-11: 1 g via INTRAVENOUS
  Filled 2016-05-10: qty 10

## 2016-05-10 MED ORDER — IPRATROPIUM-ALBUTEROL 0.5-2.5 (3) MG/3ML IN SOLN: 3.0000 mL | Freq: Four times a day (QID) | RESPIRATORY_TRACT | Status: DC | PRN

## 2016-05-10 NOTE — ED Notes (Signed)
Patient presents with c/o heartburn and cough  Stated he had a fever the last few days

## 2016-05-10 NOTE — ED Provider Notes (Signed)
CSN: [id number]     Arrival date & time 05/10/16  2141 History   First MD Initiated Contact with Patient 05/10/16 2209     Chief Complaint  Patient presents with  . Palpitations  . Cough     (Consider location/radiation/quality/duration/timing/severity/associated sxs/prior Treatment) HPI [name] is a 75 y.o. male presents to emergency department complaining of cough, shortness of breath, acid reflux symptoms. Patient states that he has had subjective fever and chills over the last 3 days. He has not measured a temperature but states he felt warm and was taking Tylenol. He reports sweats especially just after taking Tylenol. He reports cough and congestion. States he is unable to sleep at night due to severe cough. He has been taking Robitussin which has not helped. Patient states he is now feeling short of breath. He denies any chest pain. He has had some palpitations and acid reflux symptoms. States he develops burning in his chest after eating. Patient denies any prior heart problems. He states he has had pneumonia in the past and this feels the same. At this time he denies any nasal congestion, sore throat, neck pain or stiffness, chest pain or abdominal pain. Pt is a former smoker  Past Medical History  Diagnosis Date  . Prostate enlargement    History reviewed. No pertinent past surgical history. No family history on file. Social History  Substance Use Topics  . Smoking status: Former Games developer  . Smokeless tobacco: Former Neurosurgeon  . Alcohol Use: No    Review of Systems  Constitutional: Positive for fever, chills and fatigue.  Respiratory: Positive for cough and shortness of breath. Negative for chest tightness.   Cardiovascular: Positive for palpitations. Negative for chest pain and leg swelling.  Gastrointestinal: Negative for nausea, vomiting, abdominal pain, diarrhea and abdominal distention.  Genitourinary: Negative for dysuria, urgency, frequency and hematuria.   Musculoskeletal: Negative for myalgias, arthralgias, neck pain and neck stiffness.  Skin: Negative for rash.  Allergic/Immunologic: Negative for immunocompromised state.  Neurological: Positive for weakness and headaches. Negative for dizziness, light-headedness and numbness.  All other systems reviewed and are negative.     Allergies  Review of patient's allergies indicates no known allergies.  Home Medications   Prior to Admission medications   Medication Sig Start Date End Date Taking? Authorizing Provider  tamsulosin (FLOMAX) 0.4 MG CAPS capsule Take 1 capsule (0.4 mg total) by mouth daily. 06/30/14   Raeford Razor, MD   BP 129/64 mmHg  Pulse 51  Temp(Src) 98.2 F (36.8 C) (Oral)  Resp 18  Wt 98.068 kg  SpO2 97% Physical Exam  Constitutional: He is oriented to person, place, and time. He appears well-developed and well-nourished. No distress.  HENT:  Head: Normocephalic and atraumatic.  Right Ear: External ear normal.  Left Ear: External ear normal.  Nose: Nose normal.  Mouth/Throat: Oropharynx is clear and moist.  TMs normal bilaterally  Eyes: Conjunctivae are normal.  Neck: Neck supple.  Cardiovascular: Normal rate, regular rhythm and normal heart sounds.   Pulmonary/Chest: Effort normal. No respiratory distress. He has no wheezes. He has rales.  Rales in right lung base  Abdominal: Soft. Bowel sounds are normal. He exhibits no distension. There is no tenderness. There is no rebound.  Musculoskeletal: He exhibits no edema.  Neurological: He is alert and oriented to person, place, and time. No cranial nerve deficit.  Skin: Skin is warm and dry.  Nursing note and vitals reviewed.   ED Course  Procedures (including critical  care time) Labs Review Labs Reviewed  URINALYSIS, ROUTINE W REFLEX MICROSCOPIC (NOT [facility]) - Abnormal; Notable for the following:    Color, Urine ORANGE (*)    APPearance TURBID (*)    Hgb urine dipstick MODERATE (*)    Bilirubin Urine  SMALL (*)    Ketones, ur 15 (*)    Protein, ur 30 (*)    Nitrite POSITIVE (*)    Leukocytes, UA LARGE (*)    All other components within normal limits  URINE MICROSCOPIC-ADD ON - Abnormal; Notable for the following:    Squamous Epithelial / LPF 0-5 (*)    Bacteria, UA MANY (*)    All other components within normal limits  URINE CULTURE  CBC WITH DIFFERENTIAL/PLATELET  COMPREHENSIVE METABOLIC PANEL  I-STAT TROPOININ, ED    Imaging Review Dg Chest 2 View  05/10/2016  CLINICAL DATA:  Cough for a few days. EXAM: CHEST  2 VIEW COMPARISON:  Radiographs 06/10/2007.  Chest CT 05/27/2007 FINDINGS: Chronic pleural-parenchymal scarring at the right lung apex, unchanged. There is new patchy opacity in the right mid lower lung zone. Increased bronchial thickening from prior. Heart is at the upper limits of normal in size. Background emphysema. No large pleural effusion. No pneumothorax. IMPRESSION: 1. Patchy opacity in the right mid lower lung zone, concerning for pneumonia. 2. Chronic right apical scarring, unchanged over the past 9 years. 3. Emphysema.  Increased bronchial thickening. Electronically Signed   By: Rubye OaksMelanie  Ehinger M.D.   On: 05/10/2016 22:21   I have personally reviewed and evaluated these images and lab results as part of my medical decision-making.   EKG Interpretation   Date/Time:  Sunday May 10 2016 21:47:11 EDT Ventricular Rate:  101 PR Interval:  160 QRS Duration: 80 QT Interval:  344 QTC Calculation: 446 R Axis:   20 Text Interpretation:  Sinus tachycardia with frequent and consecutive  Premature ventricular complexes Abnormal ECG Multiple PVCs new from prior  ECG in 2008 Confirmed by [facility] HospitalCHLOSSMAN MD, ERIN ([id number]) on 05/10/2016 10:33:36  PM      MDM   Final diagnoses:  CAP (community acquired pneumonia)  UTI (lower urinary tract infection)  Renal insufficiency  Hyperglycemia  Abnormal ECG   Patient in emergency department with cough, congestion, subjective  fever and chills, for 3 days. Patient appears to be tachypneic on exam, coughing. Will check labs, chest x-ray, will order a breathing treatment.  Patient's chest x-ray showing possible pneumonia. Also multiple PVCs and EKG, which are new from 2008. Urinalysis also positive for infection. Patient is afebrile, he is not tachycardic, normal blood pressure. Does not qualify for sepsis. Will start IV fluids however, start Rocephin and Zithromax for community-acquired pneumonia which will also cover for his UTI. Urine cultures and blood culture sent.  Spoke with [facility], they will admit. Asked for magnesium level - ordered.    Filed Vitals:   05/10/16 2150 05/10/16 2325  BP: 129/64   Pulse: 51   Temp: 98.2 F (36.8 C)   TempSrc: Oral   Resp: 18   Weight: 98.068 kg   SpO2: 97% 95%     Jaynie Crumbleatyana Litha Lamartina, PA-C 05/11/16 0022  Jaynie Crumbleatyana Amaurie Schreckengost, PA-C 05/11/16 0022  Gilda Creasehristopher J Pollina, MD 05/11/16 [phone]

## 2016-05-11 ENCOUNTER — Observation Stay (HOSPITAL_COMMUNITY): Payer: Medicare Other

## 2016-05-11 ENCOUNTER — Encounter (HOSPITAL_COMMUNITY): Payer: Self-pay | Admitting: General Practice

## 2016-05-11 DIAGNOSIS — N179 Acute kidney failure, unspecified: Secondary | ICD-10-CM | POA: Insufficient documentation

## 2016-05-11 DIAGNOSIS — R739 Hyperglycemia, unspecified: Secondary | ICD-10-CM | POA: Diagnosis present

## 2016-05-11 DIAGNOSIS — N390 Urinary tract infection, site not specified: Secondary | ICD-10-CM | POA: Diagnosis present

## 2016-05-11 DIAGNOSIS — N170 Acute kidney failure with tubular necrosis: Secondary | ICD-10-CM | POA: Diagnosis not present

## 2016-05-11 DIAGNOSIS — N182 Chronic kidney disease, stage 2 (mild): Secondary | ICD-10-CM | POA: Diagnosis present

## 2016-05-11 DIAGNOSIS — B9620 Unspecified Escherichia coli [E. coli] as the cause of diseases classified elsewhere: Secondary | ICD-10-CM | POA: Diagnosis present

## 2016-05-11 DIAGNOSIS — A419 Sepsis, unspecified organism: Secondary | ICD-10-CM | POA: Diagnosis present

## 2016-05-11 DIAGNOSIS — N400 Enlarged prostate without lower urinary tract symptoms: Secondary | ICD-10-CM | POA: Diagnosis present

## 2016-05-11 DIAGNOSIS — J189 Pneumonia, unspecified organism: Secondary | ICD-10-CM | POA: Diagnosis present

## 2016-05-11 DIAGNOSIS — Z87891 Personal history of nicotine dependence: Secondary | ICD-10-CM | POA: Diagnosis not present

## 2016-05-11 DIAGNOSIS — I4891 Unspecified atrial fibrillation: Secondary | ICD-10-CM | POA: Diagnosis not present

## 2016-05-11 DIAGNOSIS — R9431 Abnormal electrocardiogram [ECG] [EKG]: Secondary | ICD-10-CM

## 2016-05-11 DIAGNOSIS — J440 Chronic obstructive pulmonary disease with acute lower respiratory infection: Secondary | ICD-10-CM | POA: Diagnosis present

## 2016-05-11 DIAGNOSIS — I5032 Chronic diastolic (congestive) heart failure: Secondary | ICD-10-CM | POA: Diagnosis present

## 2016-05-11 DIAGNOSIS — K219 Gastro-esophageal reflux disease without esophagitis: Secondary | ICD-10-CM | POA: Diagnosis present

## 2016-05-11 DIAGNOSIS — I480 Paroxysmal atrial fibrillation: Secondary | ICD-10-CM | POA: Diagnosis present

## 2016-05-11 LAB — COMPREHENSIVE METABOLIC PANEL
ALBUMIN: 2.8 g/dL — AB (ref 3.5–5.0)
ALT: 62 U/L (ref 17–63)
AST: 73 U/L — AB (ref 15–41)
Alkaline Phosphatase: 117 U/L (ref 38–126)
Anion gap: 8 (ref 5–15)
BUN: 31 mg/dL — AB (ref 6–20)
CHLORIDE: 107 mmol/L (ref 101–111)
CO2: 20 mmol/L — ABNORMAL LOW (ref 22–32)
Calcium: 8.2 mg/dL — ABNORMAL LOW (ref 8.9–10.3)
Creatinine, Ser: 2.14 mg/dL — ABNORMAL HIGH (ref 0.61–1.24)
GFR calc Af Amer: 33 mL/min — ABNORMAL LOW (ref >60)
GFR calc non Af Amer: 28 mL/min — ABNORMAL LOW (ref >60)
GLUCOSE: 175 mg/dL — AB (ref 65–99)
POTASSIUM: 3.6 mmol/L (ref 3.5–5.1)
Sodium: 135 mmol/L (ref 135–145)
Total Bilirubin: 1.8 mg/dL — ABNORMAL HIGH (ref 0.3–1.2)
Total Protein: 6.4 g/dL — ABNORMAL LOW (ref 6.5–8.1)

## 2016-05-11 LAB — GLUCOSE, CAPILLARY
GLUCOSE-CAPILLARY: 174 mg/dL — AB (ref 65–99)
GLUCOSE-CAPILLARY: 382 mg/dL — AB (ref 65–99)
GLUCOSE-CAPILLARY: 292 mg/dL — AB (ref 65–99)
Glucose-Capillary: 196 mg/dL — ABNORMAL HIGH (ref 65–99)
Glucose-Capillary: 304 mg/dL — ABNORMAL HIGH (ref 65–99)

## 2016-05-11 LAB — PROTIME-INR
INR: 1.32 (ref 0.00–1.49)
Prothrombin Time: 16.5 seconds — ABNORMAL HIGH (ref 11.6–15.2)

## 2016-05-11 LAB — EXPECTORATED SPUTUM ASSESSMENT W GRAM STAIN, RFLX TO RESP C

## 2016-05-11 LAB — LACTIC ACID, PLASMA: Lactic Acid, Venous: 1.3 mmol/L (ref 0.5–2.0)

## 2016-05-11 LAB — CBC
HEMATOCRIT: 35.9 % — AB (ref 39.0–52.0)
Hemoglobin: 11.3 g/dL — ABNORMAL LOW (ref 13.0–17.0)
MCH: 27.6 pg (ref 26.0–34.0)
MCHC: 31.5 g/dL (ref 30.0–36.0)
MCV: 87.6 fL (ref 78.0–100.0)
PLATELETS: 149 10*3/uL — AB (ref 150–400)
RBC: 4.1 MIL/uL — AB (ref 4.22–5.81)
RDW: 14.5 % (ref 11.5–15.5)
WBC: 4.7 10*3/uL (ref 4.0–10.5)

## 2016-05-11 LAB — SODIUM, URINE, RANDOM: SODIUM UR: 27 mmol/L

## 2016-05-11 LAB — TROPONIN I
TROPONIN I: 0.06 ng/mL — AB (ref <0.031)
TROPONIN I: 0.05 ng/mL — AB (ref <0.031)

## 2016-05-11 LAB — CREATININE, URINE, RANDOM: Creatinine, Urine: 331.7 mg/dL

## 2016-05-11 LAB — TSH: TSH: 8.308 u[IU]/mL — AB (ref 0.350–4.500)

## 2016-05-11 LAB — EXPECTORATED SPUTUM ASSESSMENT W REFEX TO RESP CULTURE

## 2016-05-11 LAB — STREP PNEUMONIAE URINARY ANTIGEN: Strep Pneumo Urinary Antigen: NEGATIVE

## 2016-05-11 LAB — MAGNESIUM: Magnesium: 1.9 mg/dL (ref 1.7–2.4)

## 2016-05-11 LAB — PROCALCITONIN: Procalcitonin: 2.27 ng/mL

## 2016-05-11 LAB — APTT: aPTT: 39 seconds — ABNORMAL HIGH (ref 24–37)

## 2016-05-11 MED ORDER — DILTIAZEM HCL 30 MG PO TABS
30.0000 mg | ORAL_TABLET | Freq: Three times a day (TID) | ORAL | Status: DC
  Administered 2016-05-11 – 2016-05-12 (×3): 30 mg via ORAL
  Filled 2016-05-11 (×3): qty 1

## 2016-05-11 MED ORDER — CEFTRIAXONE SODIUM 1 G IJ SOLR
1.0000 g | INTRAMUSCULAR | Status: DC
  Administered 2016-05-12: 1 g via INTRAVENOUS
  Filled 2016-05-11: qty 10

## 2016-05-11 MED ORDER — METHYLPREDNISOLONE SODIUM SUCC 125 MG IJ SOLR: 60.0000 mg | Freq: Two times a day (BID) | INTRAMUSCULAR | Status: DC

## 2016-05-11 MED ORDER — INSULIN ASPART 100 UNIT/ML ~~LOC~~ SOLN
0.0000 [IU] | SUBCUTANEOUS | Status: DC
  Administered 2016-05-11: 7 [IU] via SUBCUTANEOUS
  Administered 2016-05-11: 5 [IU] via SUBCUTANEOUS
  Administered 2016-05-11: 9 [IU] via SUBCUTANEOUS
  Administered 2016-05-11 – 2016-05-12 (×3): 2 [IU] via SUBCUTANEOUS
  Administered 2016-05-12 (×2): 3 [IU] via SUBCUTANEOUS
  Administered 2016-05-12: 1 [IU] via SUBCUTANEOUS
  Administered 2016-05-12: 3 [IU] via SUBCUTANEOUS
  Administered 2016-05-12: 5 [IU] via SUBCUTANEOUS
  Administered 2016-05-13: 2 [IU] via SUBCUTANEOUS
  Administered 2016-05-13 (×2): 1 [IU] via SUBCUTANEOUS

## 2016-05-11 MED ORDER — PANTOPRAZOLE SODIUM 40 MG PO TBEC
40.0000 mg | DELAYED_RELEASE_TABLET | Freq: Every day | ORAL | Status: DC
Start: 2016-05-12 — End: 2016-05-13
  Administered 2016-05-12 – 2016-05-13 (×2): 40 mg via ORAL
  Filled 2016-05-11 (×2): qty 1

## 2016-05-11 MED ORDER — SODIUM CHLORIDE 0.9 % IV SOLN
INTRAVENOUS | Status: DC
  Administered 2016-05-11 – 2016-05-13 (×5): via INTRAVENOUS

## 2016-05-11 MED ORDER — ONDANSETRON HCL 4 MG/2ML IJ SOLN: 4.0000 mg | Freq: Four times a day (QID) | INTRAMUSCULAR | Status: DC | PRN

## 2016-05-11 MED ORDER — SODIUM CHLORIDE 0.9 % IV SOLN
INTRAVENOUS | Status: DC
  Administered 2016-05-11: 03:00:00 via INTRAVENOUS

## 2016-05-11 MED ORDER — ALBUTEROL SULFATE (2.5 MG/3ML) 0.083% IN NEBU: 2.5000 mg | INHALATION_SOLUTION | RESPIRATORY_TRACT | Status: DC | PRN

## 2016-05-11 MED ORDER — ALBUTEROL SULFATE (2.5 MG/3ML) 0.083% IN NEBU: 5.0000 mg | INHALATION_SOLUTION | RESPIRATORY_TRACT | Status: AC | PRN

## 2016-05-11 MED ORDER — ACETAMINOPHEN 325 MG PO TABS: 650.0000 mg | ORAL_TABLET | Freq: Four times a day (QID) | ORAL | Status: DC | PRN

## 2016-05-11 MED ORDER — TAMSULOSIN HCL 0.4 MG PO CAPS
0.4000 mg | ORAL_CAPSULE | Freq: Every day | ORAL | Status: DC
  Administered 2016-05-11 – 2016-05-12 (×2): 0.4 mg via ORAL
  Filled 2016-05-11 (×4): qty 1

## 2016-05-11 MED ORDER — GUAIFENESIN ER 600 MG PO TB12
600.0000 mg | ORAL_TABLET | Freq: Two times a day (BID) | ORAL | Status: DC
  Administered 2016-05-11 – 2016-05-13 (×5): 600 mg via ORAL
  Filled 2016-05-11 (×6): qty 1

## 2016-05-11 MED ORDER — DILTIAZEM HCL 25 MG/5ML IV SOLN
5.0000 mg | Freq: Once | INTRAVENOUS | Status: AC
  Administered 2016-05-11: 5 mg via INTRAVENOUS
  Filled 2016-05-11: qty 5

## 2016-05-11 MED ORDER — ACETAMINOPHEN 650 MG RE SUPP: 650.0000 mg | Freq: Four times a day (QID) | RECTAL | Status: DC | PRN

## 2016-05-11 MED ORDER — METHYLPREDNISOLONE SODIUM SUCC 125 MG IJ SOLR
60.0000 mg | INTRAMUSCULAR | Status: AC
  Administered 2016-05-11: 60 mg via INTRAVENOUS
  Filled 2016-05-11: qty 2

## 2016-05-11 MED ORDER — PREDNISONE 20 MG PO TABS
40.0000 mg | ORAL_TABLET | Freq: Every day | ORAL | Status: DC
  Administered 2016-05-11 – 2016-05-13 (×3): 40 mg via ORAL
  Filled 2016-05-11 (×4): qty 2

## 2016-05-11 MED ORDER — ONDANSETRON HCL 4 MG PO TABS: 4.0000 mg | ORAL_TABLET | Freq: Four times a day (QID) | ORAL | Status: DC | PRN

## 2016-05-11 MED ORDER — FINASTERIDE 5 MG PO TABS
5.0000 mg | ORAL_TABLET | Freq: Every day | ORAL | Status: DC
  Administered 2016-05-11 – 2016-05-12 (×3): 5 mg via ORAL
  Filled 2016-05-11 (×3): qty 1

## 2016-05-11 MED ORDER — DEXTROSE 5 % IV SOLN
500.0000 mg | INTRAVENOUS | Status: DC
  Administered 2016-05-12: 500 mg via INTRAVENOUS
  Filled 2016-05-11: qty 500

## 2016-05-11 MED ORDER — HEPARIN SODIUM (PORCINE) 5000 UNIT/ML IJ SOLN
5000.0000 [IU] | Freq: Three times a day (TID) | INTRAMUSCULAR | Status: DC
  Administered 2016-05-11 – 2016-05-13 (×7): 5000 [IU] via SUBCUTANEOUS
  Filled 2016-05-11 (×7): qty 1

## 2016-05-11 MED ORDER — ALBUTEROL SULFATE (2.5 MG/3ML) 0.083% IN NEBU
2.5000 mg | INHALATION_SOLUTION | Freq: Four times a day (QID) | RESPIRATORY_TRACT | Status: DC
  Administered 2016-05-11 (×3): 2.5 mg via RESPIRATORY_TRACT
  Filled 2016-05-11 (×3): qty 3

## 2016-05-11 MED ORDER — ACETAMINOPHEN 325 MG PO TABS
650.0000 mg | ORAL_TABLET | Freq: Once | ORAL | Status: AC
  Administered 2016-05-11: 650 mg via ORAL
  Filled 2016-05-11: qty 2

## 2016-05-11 MED ORDER — PANTOPRAZOLE SODIUM 40 MG IV SOLR
40.0000 mg | INTRAVENOUS | Status: AC
  Administered 2016-05-11: 40 mg via INTRAVENOUS
  Filled 2016-05-11: qty 40

## 2016-05-11 NOTE — H&P (Addendum)
History and Physical    [name]:[id number] DOB: [birth year] DOA: 05/10/2016  Referring MD/NP/PA: Jonne Ply , PA PCP: Pcp Not In System  Patient coming from: Home  Chief Complaint:  cough  HPI: [name] is a 75 y.o. male who has a past medical history significant for BPH; who presents complaining of cough, shortness of breath, and acid reflux symptoms. Patient notes that he got caught outside in the rain 4 days ago (5/17). The following day he reports onset of a productive cough with thick green sputum production. Intermittently reporting coughing spells causing him to have nausea and vomiting on at least 3 or so occasions. Associated symptoms include generalized weakness, sweats, and subjective fever. The patient reports taking Robitussin for the cough and ibuprofen for fever symptoms without relief. Patient notes a significant history of smoking for at least 35 years but notes that he quit smoking in 1986 after a prolonged hospitalization which he had to be intubated.   ED Course: Upon admission to the emergency department patient was seen to be febrile 102F with otherwise stable vital signs. Lab work revealed normal CBC, BUN of 35, creatinine 2.69,  glucose of 237, and a troponin of 0.03. Chest x-ray revealed a right mid lower lobe pneumonia with chronic apical scarring.  UA was positive for many bacteria, large leukocytes, positive nitrate, 0-5 squamous epithelial cells, and too numerous to count WBCs. Patient was noted to have many PVCs. On the ED he was started on ceftriaxone and azithromycin. [facility] called to admit.   Review of Systems: As per HPI otherwise 10 point review of systems negative.   Past Medical History  Diagnosis Date  . Prostate enlargement     History reviewed. No pertinent past surgical history.   reports that he has quit smoking. He has quit using smokeless tobacco. He reports that he does not drink alcohol or use illicit drugs.  No Known  Allergies  History reviewed. No pertinent family history.  Prior to Admission medications   Medication Sig Start Date End Date Taking? Authorizing Provider  finasteride (PROSCAR) 5 MG tablet Take 5 mg by mouth at bedtime.   Yes Historical Provider, MD  tamsulosin (FLOMAX) 0.4 MG CAPS capsule Take 1 capsule (0.4 mg total) by mouth daily. 06/30/14  Yes Raeford Razor, MD    Physical Exam: Filed Vitals:   05/10/16 2150 05/10/16 2325  BP: 129/64   Pulse: 51   Temp: 98.2 F (36.8 C)   TempSrc: Oral   Resp: 18   Weight: 98.068 kg (216 lb 3.2 oz)   SpO2: 97% 95%      Constitutional: Elderly male who appears acutely ill, but is able to follow commands. Filed Vitals:   05/10/16 2150 05/10/16 2325  BP: 129/64   Pulse: 51   Temp: 98.2 F (36.8 C)   TempSrc: Oral   Resp: 18   Weight: 98.068 kg (216 lb 3.2 oz)   SpO2: 97% 95%   Eyes: PERRL, lids and conjunctivae normal ENMT: Mucous membranes are moist. Posterior pharynx clear of any exudate or lesions.Normal dentition.  Neck: normal, supple, no masses, no thyromegaly Respiratory: Regular respiratory effort, but bilateral wheezes with intermittent crackles appreciated. Normal respiratory effort. No accessory muscle use.  Cardiovascular: Regular rate and rhythm, no murmurs / rubs / gallops. No extremity edema. 2+ pedal pulses. No carotid bruits.  Abdomen: no tenderness, no masses palpated. No hepatosplenomegaly. Bowel sounds positive. Ventral hernia present Musculoskeletal: no clubbing / cyanosis. No joint deformity  upper and lower extremities. Good ROM, no contractures. Normal muscle tone.  Skin: no rashes, lesions, ulcers. No induration Neurologic: CN 2-12 grossly intact. Sensation intact, DTR normal. Strength 4+/5 in all 4.  Psychiatric: Normal judgment and insight. Alert and oriented x 3. Normal mood.     Labs on Admission: I have personally reviewed following labs and imaging studies  CBC:  Recent Labs Lab 05/10/16 2154    WBC 7.5  NEUTROABS 5.5  HGB 13.1  HCT 40.2  MCV 88.4  PLT 168   Basic Metabolic Panel:  Recent Labs Lab 05/10/16 2154  NA 136  K 3.7  CL 103  CO2 22  GLUCOSE 237*  BUN 35*  CREATININE 2.69*  CALCIUM 8.8*   GFR: CrCl cannot be calculated (Unknown ideal weight.). Liver Function Tests:  Recent Labs Lab 05/10/16 2154  AST 74*  ALT 60  ALKPHOS 127*  BILITOT 2.2*  PROT 7.3  ALBUMIN 3.2*   No results for input(s): LIPASE, AMYLASE in the last 168 hours. No results for input(s): AMMONIA in the last 168 hours. Coagulation Profile: No results for input(s): INR, PROTIME in the last 168 hours. Cardiac Enzymes: No results for input(s): CKTOTAL, CKMB, CKMBINDEX, TROPONINI in the last 168 hours. BNP (last 3 results) No results for input(s): PROBNP in the last 8760 hours. HbA1C: No results for input(s): HGBA1C in the last 72 hours. CBG: No results for input(s): GLUCAP in the last 168 hours. Lipid Profile: No results for input(s): CHOL, HDL, LDLCALC, TRIG, CHOLHDL, LDLDIRECT in the last 72 hours. Thyroid Function Tests: No results for input(s): TSH, T4TOTAL, FREET4, T3FREE, THYROIDAB in the last 72 hours. Anemia Panel: No results for input(s): VITAMINB12, FOLATE, FERRITIN, TIBC, IRON, RETICCTPCT in the last 72 hours. Urine analysis:    Component Value Date/Time   COLORURINE ORANGE* 05/10/2016 2153   APPEARANCEUR TURBID* 05/10/2016 2153   LABSPEC 1.025 05/10/2016 2153   PHURINE 5.5 05/10/2016 2153   GLUCOSEU NEGATIVE 05/10/2016 2153   HGBUR MODERATE* 05/10/2016 2153   BILIRUBINUR SMALL* 05/10/2016 2153   KETONESUR 15* 05/10/2016 2153   PROTEINUR 30* 05/10/2016 2153   UROBILINOGEN 0.2 06/30/2014 1815   NITRITE POSITIVE* 05/10/2016 2153   LEUKOCYTESUR LARGE* 05/10/2016 2153   Sepsis Labs: No results found for this or any previous visit (from the past 240 hour(s)).   Radiological Exams on Admission: Dg Chest 2 View  05/10/2016  CLINICAL DATA:  Cough for a few  days. EXAM: CHEST  2 VIEW COMPARISON:  Radiographs 06/10/2007.  Chest CT 05/27/2007 FINDINGS: Chronic pleural-parenchymal scarring at the right lung apex, unchanged. There is new patchy opacity in the right mid lower lung zone. Increased bronchial thickening from prior. Heart is at the upper limits of normal in size. Background emphysema. No large pleural effusion. No pneumothorax. IMPRESSION: 1. Patchy opacity in the right mid lower lung zone, concerning for pneumonia. 2. Chronic right apical scarring, unchanged over the past 9 years. 3. Emphysema.  Increased bronchial thickening. Electronically Signed   By: Rubye OaksMelanie  Ehinger M.D.   On: 05/10/2016 22:21    EKG: Independently reviewed. Sinus tachycardia rate of 101 with frequent PVCs  Assessment/Plan  Sepsis secondary to  CAP (community acquired pneumonia)/: Patient with cough, congestion generalized weakness. Who did have a fever up to 102.81F in the ED with elevated respiratory rate to 27. Chest x-ray reveals a right lower lobe pneumonia - Admit to a telemetry bed - Sepsis protocol initiated - Follow-up lactic acid level, pro-calcitonin,  blood/sputum cultures - Continue  empiric antibiotics of Rocephin and azithromycin - Mucinex  Acute COPD exacerbation/emphysema:   Patient with a significant remote history of smoking for 35 years of at least 1-2 packs per day who quit in 1986. Physical exam tonight patient with bilateral wheezes appreciated. - Continuous pulse oximetry with nasal cannula oxygen as needed to keep O2 sats greater than 92% - Duonebs every 6 hours - Solu-Medrol IV 1 dose now  - Evaluate in a.m. for possible transition to by oral steroids   Urinary tract infection: Acute. Patient with urinary frequency - Follow-up urine culture - Antibiotics as seen above  Acute renal failure: Patient's baseline kidney function is unknown at this time. Question if patient's recent use of NSAIDs further worsen kidney function. - FeNa - Check  renal ultrasound  - IV fluids of normal saline at 100 mL per hour  Hyperglycemia: Patient blood sugar as high as 236 on admission - Check a hemoglobin A1c - Hypoglycemic protocols - CBGs every 4 hours for now with sensitive sliding scale insulin   Transaminitis: Acute. Patient denies any alcohol use. - Follow-up repeat CMP - May warrant further resuscitation in a.m.  GERD - protonix  BPH - Continue Proscar and Flomax  DVT prophylaxis: Lovenox  Code Status:  Full Family Communication: Discuss overall plan with patient's sisters-in -laws present at bedside  Disposition Plan: Possible discharge home in 2-4 days  Consults called:  None Admission status:  Observation telemetry  Clydie Braun MD [facility] Pager [phone]  If 7PM-7AM, please contact night-coverage [email or web address] Password [email or web address]  05/11/2016, 12:12 AM

## 2016-05-11 NOTE — Progress Notes (Signed)
[facility] PROGRESS NOTE  [name] WUJ:[id number] DOB: [birth year] DOA: 05/10/2016   PCP: Pcp Not In System     Assessment/Plan: Principal Problem:   Sepsis (HCC) Active Problems:   CAP (community acquired pneumonia)   Infection of urinary tract   Acute renal failure (HCC)   GERD (gastroesophageal reflux disease)   BPH (benign prostatic hyperplasia)   Abnormal ECG   Acute renal failure (ARF) (HCC)   Brief summary 75 y.o. male who has a past medical history significant for BPH; who presents complaining of cough, shortness of breath, and acid reflux symptoms. Patient notes that he got caught outside in the rain 4 days ago (5/17). The following day he reports onset of a productive cough with thick green sputum production.Upon admission to the emergency department patient was seen to be febrile 102F with otherwise stable vital signs. Lab work revealed normal CBC, BUN of 35, creatinine 2.69, glucose of 237, and a troponin of 0.03. Chest x-ray revealed a right mid lower lobe pneumonia  . Patient admitted for pneumonia and UTI  Assessment and plan  Sepsis secondary to CAP (community acquired pneumonia)/: Patient with cough, congestion generalized weakness. Who did have a fever up to 102.55F in the ED with elevated respiratory rate to 27. Chest x-ray reveals a right lower lobe pneumonia Continue telemetry - Sepsis protocol initiated - Follow-up lactic acid level, pro-calcitonin, blood/sputum cultures - Continue empiric antibiotics of Rocephin and azithromycin - Mucinex  Paroxysmal atrial fibrillation, currently normal sinus rhythm Likely in the setting of pulmonary process We'll start the patient on by mouth Cardizem Continue to cycle cardiac enzymes 2-D echo  Acute COPD exacerbation/emphysema: Patient with a significant remote history of smoking for 35 years of at least 1-2 packs per day who quit in 1986. Physical exam tonight patient with bilateral wheezes  appreciated. - Continuous pulse oximetry with nasal cannula oxygen as needed to keep O2 sats greater than 92% - Duonebs every 6 hours - Solu-Medrol IV 1 dose now, will continue with low-dose prednisone - Evaluate in a.m. for possible transition to by oral steroids   Urinary tract infection: Acute. Patient with urinary frequency - Follow-up urine culture - Antibiotics as seen above  Acute renal failure, chronic kidney disease stage II: Apparently baseline is 1.4 Patient's baseline kidney function is unknown at this time. Likely prerenal - FeNa - IV fluids of normal saline at 100 mL per hour  Hyperglycemia: Patient blood sugar as high as 236 on admission - Check a hemoglobin A1c - Hypoglycemic protocols - CBGs every 4 hours for now with sensitive sliding scale insulin   Transaminitis: Acute. Patient denies any alcohol use. - Follow-up repeat CMP - May warrant further resuscitation in a.m.  GERD - protonix  BPH - Continue Proscar and Flomax    DVT prophylaxsis heparin  Code Status:  Full code   Family Communication: Discussed in detail with the patient, all imaging results, lab results explained to the patient   Disposition Plan:  Anticipate discharge in one to 2 days      Consultants:  None  Procedures:  2-D echo  Antibiotics: Rocephin 5/28- Azithromycin 5/28-       HPI/Subjective: Patient feeling significantly better than yesterday, developed atrial fibrillation last night but converted back to sinus rhythm  Objective: Filed Vitals:   05/11/16 0115 05/11/16 0205 05/11/16 0227 05/11/16 0520  BP: 113/62 134/70  121/66  Pulse: 89 94  86  Temp:  98.1 F (36.7  C)    TempSrc:  Oral    Resp: 24 22    Height:  6\' 3"  (1.905 m)    Weight:  97.569 kg (215 lb 1.6 oz)    SpO2: 95% 94% 95% 93%    Intake/Output Summary (Last 24 hours) at 05/11/16 0754 Last data filed at 05/11/16 0300  Gross per 24 hour  Intake   1240 ml  Output    175 ml  Net   1065 ml     Exam:  Examination:  General exam: Appears calm and comfortable  Respiratory system: Clear to auscultation. Respiratory effort normal. Cardiovascular system: S1 & S2 heard, RRR. No JVD, murmurs, rubs, gallops or clicks. No pedal edema. Gastrointestinal system: Abdomen is nondistended, soft and nontender. No organomegaly or masses felt. Normal bowel sounds heard. Central nervous system: Alert and oriented. No focal neurological deficits. Extremities: Symmetric 5 x 5 power. Skin: No rashes, lesions or ulcers Psychiatry: Judgement and insight appear normal. Mood & affect appropriate.     Data Reviewed: I have personally reviewed following labs and imaging studies  Micro Results No results found for this or any previous visit (from the past 240 hour(s)).  Radiology Reports Dg Chest 2 View  05/10/2016  CLINICAL DATA:  Cough for a few days. EXAM: CHEST  2 VIEW COMPARISON:  Radiographs 06/10/2007.  Chest CT 05/27/2007 FINDINGS: Chronic pleural-parenchymal scarring at the right lung apex, unchanged. There is new patchy opacity in the right mid lower lung zone. Increased bronchial thickening from prior. Heart is at the upper limits of normal in size. Background emphysema. No large pleural effusion. No pneumothorax. IMPRESSION: 1. Patchy opacity in the right mid lower lung zone, concerning for pneumonia. 2. Chronic right apical scarring, unchanged over the past 9 years. 3. Emphysema.  Increased bronchial thickening. Electronically Signed   By: Rubye OaksMelanie  Ehinger M.D.   On: 05/10/2016 22:21   [place] Renal  05/11/2016  CLINICAL DATA:  Acute onset of renal failure.  Initial encounter. EXAM: RENAL / URINARY TRACT ULTRASOUND COMPLETE COMPARISON:  Abdominal ultrasound performed 05/28/2007 FINDINGS: Right Kidney: Length: 10.5 cm. Echogenicity within normal limits. No mass or hydronephrosis visualized. Left Kidney: Length: 11.8 cm. Echogenicity within normal limits. A 2.2 cm cyst is noted at the upper pole of  the left kidney. No hydronephrosis visualized. Bladder: Appears normal for degree of bladder distention. A significantly enlarged prostate is noted, measuring 7.4 cm in transverse dimension. IMPRESSION: 1. No evidence of hydronephrosis. 2. Small left renal cyst noted. 3. Significantly enlarged prostate noted. Electronically Signed   By: Roanna RaiderJeffery  Chang M.D.   On: 05/11/2016 01:40     CBC  Recent Labs Lab 05/10/16 2154 05/11/16 0420  WBC 7.5 4.7  HGB 13.1 11.3*  HCT 40.2 35.9*  PLT 168 149*  MCV 88.4 87.6  MCH 28.8 27.6  MCHC 32.6 31.5  RDW 14.3 14.5  LYMPHSABS 1.0  --   MONOABS 1.0  --   EOSABS 0.0  --   BASOSABS 0.0  --     Chemistries   Recent Labs Lab 05/10/16 2154 05/11/16 0420  NA 136 135  K 3.7 3.6  CL 103 107  CO2 22 20*  GLUCOSE 237* 175*  BUN 35* 31*  CREATININE 2.69* 2.14*  CALCIUM 8.8* 8.2*  MG  --  1.9  AST 74* 73*  ALT 60 62  ALKPHOS 127* 117  BILITOT 2.2* 1.8*   ------------------------------------------------------------------------------------------------------------------ estimated creatinine clearance is 35.1 mL/min (by C-G formula based on Cr of  2.14). ------------------------------------------------------------------------------------------------------------------ No results for input(s): HGBA1C in the last 72 hours. ------------------------------------------------------------------------------------------------------------------ No results for input(s): CHOL, HDL, LDLCALC, TRIG, CHOLHDL, LDLDIRECT in the last 72 hours. ------------------------------------------------------------------------------------------------------------------ No results for input(s): TSH, T4TOTAL, T3FREE, THYROIDAB in the last 72 hours.  Invalid input(s): FREET3 ------------------------------------------------------------------------------------------------------------------ No results for input(s): VITAMINB12, FOLATE, FERRITIN, TIBC, IRON, RETICCTPCT in the last 72  hours.  Coagulation profile  Recent Labs Lab 05/11/16 0420  INR 1.32    No results for input(s): DDIMER in the last 72 hours.  Cardiac Enzymes  Recent Labs Lab 05/11/16 0420 05/11/16 0633  TROPONINI 0.06* 0.05*   ------------------------------------------------------------------------------------------------------------------ Invalid input(s): POCBNP   CBG:  Recent Labs Lab 05/11/16 0435  GLUCAP 174*       Studies: Dg Chest 2 View  05/10/2016  CLINICAL DATA:  Cough for a few days. EXAM: CHEST  2 VIEW COMPARISON:  Radiographs 06/10/2007.  Chest CT 05/27/2007 FINDINGS: Chronic pleural-parenchymal scarring at the right lung apex, unchanged. There is new patchy opacity in the right mid lower lung zone. Increased bronchial thickening from prior. Heart is at the upper limits of normal in size. Background emphysema. No large pleural effusion. No pneumothorax. IMPRESSION: 1. Patchy opacity in the right mid lower lung zone, concerning for pneumonia. 2. Chronic right apical scarring, unchanged over the past 9 years. 3. Emphysema.  Increased bronchial thickening. Electronically Signed   By: Rubye Oaks M.D.   On: 05/10/2016 22:21   US Renal  05/11/2016  CLINICAL DATA:  Acute onset of renal failure.  Initial encounter. EXAM: RENAL / URINARY TRACT ULTRASOUND COMPLETE COMPARISON:  Abdominal ultrasound performed 05/28/2007 FINDINGS: Right Kidney: Length: 10.5 cm. Echogenicity within normal limits. No mass or hydronephrosis visualized. Left Kidney: Length: 11.8 cm. Echogenicity within normal limits. A 2.2 cm cyst is noted at the upper pole of the left kidney. No hydronephrosis visualized. Bladder: Appears normal for degree of bladder distention. A significantly enlarged prostate is noted, measuring 7.4 cm in transverse dimension. IMPRESSION: 1. No evidence of hydronephrosis. 2. Small left renal cyst noted. 3. Significantly enlarged prostate noted. Electronically Signed   By: Roanna Raider M.D.   On: 05/11/2016 01:40      No results found for: HGBA1C Lab Results  Component Value Date   CREATININE 2.14* 05/11/2016       Scheduled Meds: . sodium chloride   Intravenous STAT  . albuterol  2.5 mg Nebulization Q6H  . [START ON 05/12/2016] azithromycin  500 mg Intravenous Q24H  . [START ON 05/12/2016] cefTRIAXone (ROCEPHIN)  IV  1 g Intravenous Q24H  . finasteride  5 mg Oral QHS  . guaiFENesin  600 mg Oral BID  . heparin  5,000 Units Subcutaneous Q8H  . insulin aspart  0-9 Units Subcutaneous Q4H  . ipratropium-albuterol  3 mL Nebulization TID  . [START ON 05/12/2016] pantoprazole  40 mg Oral Daily  . tamsulosin  0.4 mg Oral Daily   Continuous Infusions: . sodium chloride 125 mL/hr at 05/11/16 0229        Time spent: >30 MINS    [facility]  [facility] Pager [phone]. If 7PM-7AM, please contact night-coverage at [email or web address], password [email or web address] 05/11/2016, 7:54 AM

## 2016-05-11 NOTE — Progress Notes (Signed)
Patient went in to A-Fib towards end of shift. V/S done, EKG done and  [facility], but @ the same time, during the EKG, he converted back to SR. Then again towards the latter part of my shift he was in/out A Fib & SR. [facility]ib[facility] BillingsCallahan notified again and Cardizem 5 MG IV was ordered. Patient received Cardizem slowly . HR now down to 80' & 90.

## 2016-05-11 NOTE — Progress Notes (Signed)
Sputum collection device place in room.

## 2016-05-11 NOTE — ED Notes (Signed)
Pt taken to US

## 2016-05-11 NOTE — ED Notes (Signed)
Pt returned for US. 

## 2016-05-12 ENCOUNTER — Inpatient Hospital Stay (HOSPITAL_COMMUNITY): Payer: Medicare Other

## 2016-05-12 DIAGNOSIS — I4891 Unspecified atrial fibrillation: Secondary | ICD-10-CM

## 2016-05-12 LAB — ECHOCARDIOGRAM COMPLETE
Height: 75 in
Weight: 3524.8 oz

## 2016-05-12 LAB — COMPREHENSIVE METABOLIC PANEL
ALK PHOS: 115 U/L (ref 38–126)
ALT: 56 U/L (ref 17–63)
ANION GAP: 8 (ref 5–15)
AST: 43 U/L — ABNORMAL HIGH (ref 15–41)
Albumin: 2.6 g/dL — ABNORMAL LOW (ref 3.5–5.0)
BILIRUBIN TOTAL: 0.7 mg/dL (ref 0.3–1.2)
BUN: 31 mg/dL — ABNORMAL HIGH (ref 6–20)
CALCIUM: 8.6 mg/dL — AB (ref 8.9–10.3)
CO2: 22 mmol/L (ref 22–32)
Chloride: 106 mmol/L (ref 101–111)
Creatinine, Ser: 1.71 mg/dL — ABNORMAL HIGH (ref 0.61–1.24)
GFR, EST AFRICAN AMERICAN: 43 mL/min — AB (ref >60)
GFR, EST NON AFRICAN AMERICAN: 37 mL/min — AB (ref >60)
Glucose, Bld: 213 mg/dL — ABNORMAL HIGH (ref 65–99)
Potassium: 4 mmol/L (ref 3.5–5.1)
SODIUM: 136 mmol/L (ref 135–145)
TOTAL PROTEIN: 6.3 g/dL — AB (ref 6.5–8.1)

## 2016-05-12 LAB — GLUCOSE, CAPILLARY
GLUCOSE-CAPILLARY: 235 mg/dL — AB (ref 65–99)
Glucose-Capillary: 162 mg/dL — ABNORMAL HIGH (ref 65–99)
Glucose-Capillary: 139 mg/dL — ABNORMAL HIGH (ref 65–99)
Glucose-Capillary: 249 mg/dL — ABNORMAL HIGH (ref 65–99)
Glucose-Capillary: 224 mg/dL — ABNORMAL HIGH (ref 65–99)
Glucose-Capillary: 274 mg/dL — ABNORMAL HIGH (ref 65–99)

## 2016-05-12 LAB — CBC
HEMATOCRIT: 33.8 % — AB (ref 39.0–52.0)
HEMOGLOBIN: 10.6 g/dL — AB (ref 13.0–17.0)
MCH: 27.5 pg (ref 26.0–34.0)
MCHC: 31.4 g/dL (ref 30.0–36.0)
MCV: 87.8 fL (ref 78.0–100.0)
Platelets: 163 10*3/uL (ref 150–400)
RBC: 3.85 MIL/uL — ABNORMAL LOW (ref 4.22–5.81)
RDW: 14.7 % (ref 11.5–15.5)
WBC: 6.2 10*3/uL (ref 4.0–10.5)

## 2016-05-12 LAB — HEMOGLOBIN A1C
HEMOGLOBIN A1C: 6.2 % — AB (ref 4.8–5.6)
MEAN PLASMA GLUCOSE: 131 mg/dL

## 2016-05-12 MED ORDER — LEVOFLOXACIN 750 MG PO TABS
750.0000 mg | ORAL_TABLET | ORAL | Status: DC
  Administered 2016-05-12: 750 mg via ORAL
  Filled 2016-05-12: qty 1

## 2016-05-12 MED ORDER — DILTIAZEM HCL ER COATED BEADS 120 MG PO CP24
120.0000 mg | ORAL_CAPSULE | Freq: Every day | ORAL | Status: DC
  Administered 2016-05-12 – 2016-05-13 (×2): 120 mg via ORAL
  Filled 2016-05-12 (×2): qty 1

## 2016-05-12 NOTE — Progress Notes (Signed)
  Echocardiogram 2D Echocardiogram has been performed.  Shawn SavoyCasey N Cherron Blitzer 05/12/2016, 2:53 PM

## 2016-05-12 NOTE — Progress Notes (Signed)
Patient ambulated hallway for 120 ft. Oxygen saturation maintained at 95% on room air while ambulating. Heart rate increased to the 120's briefly but mostly maintained in the 80's. Patient denied shortness of breath; only complaint is persistent congested cough. Will continue to monitor.

## 2016-05-12 NOTE — Progress Notes (Signed)
[facility] PROGRESS NOTE  [name] ZOX:[id number] DOB: [birth year] DOA: 05/10/2016   PCP: Pcp Not In System     Assessment/Plan: Principal Problem:   Sepsis (HCC) Active Problems:   CAP (community acquired pneumonia)   Infection of urinary tract   Acute renal failure (HCC)   GERD (gastroesophageal reflux disease)   BPH (benign prostatic hyperplasia)   Abnormal ECG   Acute renal failure (ARF) (HCC)   Brief summary 75 y.o. male who has a past medical history significant for BPH; who presents complaining of cough, shortness of breath, and acid reflux symptoms. Patient notes that he got caught outside in the rain 4 days ago (5/17). The following day he reports onset of a productive cough with thick green sputum production.Upon admission to the emergency department patient was seen to be febrile 102F with otherwise stable vital signs. Lab work revealed normal CBC, BUN of 35, creatinine 2.69, glucose of 237, and a troponin of 0.03. Chest x-ray revealed a right mid lower lobe pneumonia  . Patient admitted for pneumonia and UTI  Assessment and plan  Sepsis secondary to CAP (community acquired pneumonia)/: Patient with cough, congestion generalized weakness. Who did have a fever up to 102.71F in the ED with elevated respiratory rate to 27. Chest x-ray reveals a right lower lobe pneumonia Continue telemetry, no recurrent atrial fibrillation - Follow-up lactic acid level normal, pro-calcitonin 2.27 , blood/sputum cultures no growth so far - Continue empiric antibiotics of Rocephin and azithromycin - Mucinex  Paroxysmal atrial fibrillation, currently normal sinus rhythm Likely in the setting of pulmonary process Started on PO Cardizem, change to Cardizem CD TSH 8.3 Continue to cycle cardiac enzymes 2-D echo pending  Acute COPD exacerbation/emphysema: Patient with a significant remote history of smoking for 35 years of at least 1-2 packs per day who quit in 1986.  Physical exam tonight patient with bilateral wheezes appreciated. - Continuous pulse oximetry with nasal cannula oxygen as needed to keep O2 sats greater than 92% - Duonebs every 6 hours - Solu-Medrol IV 1 dose now, will continue with low-dose prednisone - Evaluate in a.m. for possible transition to by oral steroids   Urinary tract infection: Acute. Patient with urinary frequency - Follow-up urine culture - Antibiotics as seen above  Acute renal failure, chronic kidney disease stage II: Apparently baseline is 1.4 Patient's baseline kidney function is unknown at this time. Likely prerenal, renal ultrasound does not show any hydronephrosis, continue gentle IV hydration  Renal function improving, 2.69> 1.71   Hyperglycemia: Prediabetes, Patient blood sugar as high as 236 on admission Hemoglobin A1c 6.2 - Hypoglycemic protocols - CBGs every 4 hours for now with sensitive sliding scale insulin   Transaminitis: Acute. Patient denies any alcohol use. Improving - Follow-up repeat CMP    GERD - protonix  BPH - Continue Proscar and Flomax    DVT prophylaxsis heparin  Code Status:  Full code   Family Communication: Discussed in detail with the patient, all imaging results, lab results explained to the patient   Disposition Plan:  Anticipate discharge in one to 2 days      Consultants:  None  Procedures:  2-D echo  Antibiotics: Rocephin 5/28- Azithromycin 5/28-       HPI/Subjective: Patient feeling significantly better than yesterday, developed atrial fibrillation last night but converted back to sinus rhythm  Objective: Filed Vitals:   05/11/16 2040 05/12/16 0040 05/12/16 0500 05/12/16 0736  BP: 122/59 110/74 122/63   Pulse:  78 69 64   Temp: 97.6 F (36.4 C) 97.8 F (36.6 C) 97.9 F (36.6 C)   TempSrc: Oral Oral Oral   Resp: 18 18 18    Height:      Weight:   99.927 kg (220 lb 4.8 oz)   SpO2: 95% 94% 94% 94%    Intake/Output Summary (Last 24 hours) at  05/12/16 1026 Last data filed at 05/12/16 0900  Gross per 24 hour  Intake   3140 ml  Output   1051 ml  Net   2089 ml    Exam:  Examination:  General exam: Appears calm and comfortable  Respiratory system: Clear to auscultation. Respiratory effort normal. Cardiovascular system: S1 & S2 heard, RRR. No JVD, murmurs, rubs, gallops or clicks. No pedal edema. Gastrointestinal system: Abdomen is nondistended, soft and nontender. No organomegaly or masses felt. Normal bowel sounds heard. Central nervous system: Alert and oriented. No focal neurological deficits. Extremities: Symmetric 5 x 5 power. Skin: No rashes, lesions or ulcers Psychiatry: Judgement and insight appear normal. Mood & affect appropriate.     Data Reviewed: I have personally reviewed following labs and imaging studies  Micro Results Recent Results (from the past 240 hour(s))  Culture, sputum-assessment     Status: None   Collection Time: 05/11/16  6:10 AM  Result Value Ref Range Status   Specimen Description SPUTUM  Final   Special Requests Immunocompromised  Final   Sputum evaluation   Final    MICROSCOPIC FINDINGS SUGGEST THAT THIS SPECIMEN IS NOT REPRESENTATIVE OF LOWER RESPIRATORY SECRETIONS. PLEASE RECOLLECT. Gram Stain Report Called to,Read Back By and Verified With: Rosary Lively AT 1610 05/11/16 BY L BENFIELD    Report Status 05/11/2016 FINAL  Final    Radiology Reports Dg Chest 2 View  05/10/2016  CLINICAL DATA:  Cough for a few days. EXAM: CHEST  2 VIEW COMPARISON:  Radiographs 06/10/2007.  Chest CT 05/27/2007 FINDINGS: Chronic pleural-parenchymal scarring at the right lung apex, unchanged. There is new patchy opacity in the right mid lower lung zone. Increased bronchial thickening from prior. Heart is at the upper limits of normal in size. Background emphysema. No large pleural effusion. No pneumothorax. IMPRESSION: 1. Patchy opacity in the right mid lower lung zone, concerning for pneumonia. 2. Chronic right  apical scarring, unchanged over the past 9 years. 3. Emphysema.  Increased bronchial thickening. Electronically Signed   By: Rubye Oaks M.D.   On: 05/10/2016 22:21   US Renal  05/11/2016  CLINICAL DATA:  Acute onset of renal failure.  Initial encounter. EXAM: RENAL / URINARY TRACT ULTRASOUND COMPLETE COMPARISON:  Abdominal ultrasound performed 05/28/2007 FINDINGS: Right Kidney: Length: 10.5 cm. Echogenicity within normal limits. No mass or hydronephrosis visualized. Left Kidney: Length: 11.8 cm. Echogenicity within normal limits. A 2.2 cm cyst is noted at the upper pole of the left kidney. No hydronephrosis visualized. Bladder: Appears normal for degree of bladder distention. A significantly enlarged prostate is noted, measuring 7.4 cm in transverse dimension. IMPRESSION: 1. No evidence of hydronephrosis. 2. Small left renal cyst noted. 3. Significantly enlarged prostate noted. Electronically Signed   By: Roanna Raider M.D.   On: 05/11/2016 01:40     CBC  Recent Labs Lab 05/10/16 2154 05/11/16 0420 05/12/16 0253  WBC 7.5 4.7 6.2  HGB 13.1 11.3* 10.6*  HCT 40.2 35.9* 33.8*  PLT 168 149* 163  MCV 88.4 87.6 87.8  MCH 28.8 27.6 27.5  MCHC 32.6 31.5 31.4  RDW 14.3 14.5 14.7  LYMPHSABS 1.0  --   --   MONOABS 1.0  --   --   EOSABS 0.0  --   --   BASOSABS 0.0  --   --     Chemistries   Recent Labs Lab 05/10/16 2154 05/11/16 0420 05/12/16 0253  NA 136 135 136  K 3.7 3.6 4.0  CL 103 107 106  CO2 22 20* 22  GLUCOSE 237* 175* 213*  BUN 35* 31* 31*  CREATININE 2.69* 2.14* 1.71*  CALCIUM 8.8* 8.2* 8.6*  MG  --  1.9  --   AST 74* 73* 43*  ALT 60 62 56  ALKPHOS 127* 117 115  BILITOT 2.2* 1.8* 0.7   ------------------------------------------------------------------------------------------------------------------ estimated creatinine clearance is 43.9 mL/min (by C-G formula based on Cr of  1.71). ------------------------------------------------------------------------------------------------------------------  Recent Labs  05/11/16 0420  HGBA1C 6.2*   ------------------------------------------------------------------------------------------------------------------ No results for input(s): CHOL, HDL, LDLCALC, TRIG, CHOLHDL, LDLDIRECT in the last 72 hours. ------------------------------------------------------------------------------------------------------------------  Recent Labs  05/11/16 1052  TSH 8.308*   ------------------------------------------------------------------------------------------------------------------ No results for input(s): VITAMINB12, FOLATE, FERRITIN, TIBC, IRON, RETICCTPCT in the last 72 hours.  Coagulation profile  Recent Labs Lab 05/11/16 0420  INR 1.32    No results for input(s): DDIMER in the last 72 hours.  Cardiac Enzymes  Recent Labs Lab 05/11/16 0420 05/11/16 0633  TROPONINI 0.06* 0.05*   ------------------------------------------------------------------------------------------------------------------ Invalid input(s): POCBNP   CBG:  Recent Labs Lab 05/11/16 1629 05/11/16 2122 05/12/16 0025 05/12/16 0457 05/12/16 0820  GLUCAP 382* 292* 235* 162* 139*       Studies: Dg Chest 2 View  05/10/2016  CLINICAL DATA:  Cough for a few days. EXAM: CHEST  2 VIEW COMPARISON:  Radiographs 06/10/2007.  Chest CT 05/27/2007 FINDINGS: Chronic pleural-parenchymal scarring at the right lung apex, unchanged. There is new patchy opacity in the right mid lower lung zone. Increased bronchial thickening from prior. Heart is at the upper limits of normal in size. Background emphysema. No large pleural effusion. No pneumothorax. IMPRESSION: 1. Patchy opacity in the right mid lower lung zone, concerning for pneumonia. 2. Chronic right apical scarring, unchanged over the past 9 years. 3. Emphysema.  Increased bronchial thickening.  Electronically Signed   By: Rubye OaksMelanie  Ehinger M.D.   On: 05/10/2016 22:21   [place] Renal  05/11/2016  CLINICAL DATA:  Acute onset of renal failure.  Initial encounter. EXAM: RENAL / URINARY TRACT ULTRASOUND COMPLETE COMPARISON:  Abdominal ultrasound performed 05/28/2007 FINDINGS: Right Kidney: Length: 10.5 cm. Echogenicity within normal limits. No mass or hydronephrosis visualized. Left Kidney: Length: 11.8 cm. Echogenicity within normal limits. A 2.2 cm cyst is noted at the upper pole of the left kidney. No hydronephrosis visualized. Bladder: Appears normal for degree of bladder distention. A significantly enlarged prostate is noted, measuring 7.4 cm in transverse dimension. IMPRESSION: 1. No evidence of hydronephrosis. 2. Small left renal cyst noted. 3. Significantly enlarged prostate noted. Electronically Signed   By: Roanna RaiderJeffery  Chang M.D.   On: 05/11/2016 01:40      Lab Results  Component Value Date   HGBA1C 6.2* 05/11/2016   Lab Results  Component Value Date   CREATININE 1.71* 05/12/2016       Scheduled Meds: . cefTRIAXone (ROCEPHIN)  IV  1 g Intravenous Q24H  . diltiazem  30 mg Oral Q8H  . finasteride  5 mg Oral QHS  . guaiFENesin  600 mg Oral BID  . heparin  5,000 Units Subcutaneous Q8H  . insulin aspart  0-9 Units Subcutaneous Q4H  .  ipratropium-albuterol  3 mL Nebulization TID  . levofloxacin  750 mg Oral Q48H  . pantoprazole  40 mg Oral Daily  . predniSONE  40 mg Oral Q breakfast  . tamsulosin  0.4 mg Oral Daily   Continuous Infusions: . sodium chloride 100 mL/hr at 05/12/16 0400     LOS: 1 day    Time spent: >30 MINS    [facility]  [facility] Pager [phone]. If 7PM-7AM, please contact night-coverage at [email or web address], password [facility] 05/12/2016, 10:26 AM  LOS: 1 day

## 2016-05-13 LAB — LEGIONELLA PNEUMOPHILA SEROGP 1 UR AG: L. PNEUMOPHILA SEROGP 1 UR AG: NEGATIVE

## 2016-05-13 LAB — COMPREHENSIVE METABOLIC PANEL
ALBUMIN: 2.4 g/dL — AB (ref 3.5–5.0)
ALK PHOS: 126 U/L (ref 38–126)
ALT: 107 U/L — ABNORMAL HIGH (ref 17–63)
ANION GAP: 7 (ref 5–15)
AST: 92 U/L — ABNORMAL HIGH (ref 15–41)
BUN: 27 mg/dL — ABNORMAL HIGH (ref 6–20)
CALCIUM: 8.7 mg/dL — AB (ref 8.9–10.3)
CHLORIDE: 111 mmol/L (ref 101–111)
CO2: 22 mmol/L (ref 22–32)
Creatinine, Ser: 1.41 mg/dL — ABNORMAL HIGH (ref 0.61–1.24)
GFR calc Af Amer: 54 mL/min — ABNORMAL LOW (ref >60)
GFR calc non Af Amer: 47 mL/min — ABNORMAL LOW (ref >60)
GLUCOSE: 121 mg/dL — AB (ref 65–99)
POTASSIUM: 4.2 mmol/L (ref 3.5–5.1)
SODIUM: 140 mmol/L (ref 135–145)
Total Bilirubin: 0.6 mg/dL (ref 0.3–1.2)
Total Protein: 6 g/dL — ABNORMAL LOW (ref 6.5–8.1)

## 2016-05-13 LAB — CBC
HCT: 35.9 % — ABNORMAL LOW (ref 39.0–52.0)
HEMOGLOBIN: 11.1 g/dL — AB (ref 13.0–17.0)
MCH: 27.3 pg (ref 26.0–34.0)
MCHC: 30.9 g/dL (ref 30.0–36.0)
MCV: 88.4 fL (ref 78.0–100.0)
PLATELETS: 177 10*3/uL (ref 150–400)
RBC: 4.06 MIL/uL — ABNORMAL LOW (ref 4.22–5.81)
RDW: 14.9 % (ref 11.5–15.5)
WBC: 7.7 10*3/uL (ref 4.0–10.5)

## 2016-05-13 LAB — URINE CULTURE: Culture: >=100000 — AB

## 2016-05-13 LAB — GLUCOSE, CAPILLARY
GLUCOSE-CAPILLARY: 132 mg/dL — AB (ref 65–99)
GLUCOSE-CAPILLARY: 125 mg/dL — AB (ref 65–99)
Glucose-Capillary: 199 mg/dL — ABNORMAL HIGH (ref 65–99)

## 2016-05-13 MED ORDER — PANTOPRAZOLE SODIUM 40 MG PO TBEC: 40.0000 mg | DELAYED_RELEASE_TABLET | Freq: Every day | ORAL | Status: AC

## 2016-05-13 MED ORDER — DILTIAZEM HCL ER COATED BEADS 120 MG PO CP24
120.0000 mg | ORAL_CAPSULE | Freq: Every day | ORAL | Status: AC
Start: 2016-05-13 — End: ?

## 2016-05-13 MED ORDER — ALBUTEROL SULFATE HFA 108 (90 BASE) MCG/ACT IN AERS: 1.0000 | INHALATION_SPRAY | Freq: Four times a day (QID) | RESPIRATORY_TRACT | Status: AC | PRN

## 2016-05-13 MED ORDER — LEVOFLOXACIN 750 MG PO TABS: 750.0000 mg | ORAL_TABLET | ORAL | Status: DC

## 2016-05-13 MED ORDER — LEVOFLOXACIN 500 MG PO TABS: 500.0000 mg | ORAL_TABLET | Freq: Every day | ORAL | Status: DC

## 2016-05-13 MED ORDER — FUROSEMIDE 20 MG PO TABS
20.0000 mg | ORAL_TABLET | Freq: Every day | ORAL | Status: AC
Start: 2016-05-13 — End: ?

## 2016-05-13 MED ORDER — GUAIFENESIN ER 600 MG PO TB12: 600.0000 mg | ORAL_TABLET | Freq: Two times a day (BID) | ORAL | Status: AC

## 2016-05-13 MED ORDER — PREDNISONE 20 MG PO TABS: 40.0000 mg | ORAL_TABLET | Freq: Every day | ORAL | Status: DC

## 2016-05-13 NOTE — Progress Notes (Signed)
Orders received for pt discharge.  Discharge summary printed and reviewed with pt.  Explained medication regimen, and pt had no further questions at this time.  IV removed and site remains clean, dry, intact.  Telemetry removed.  Pt in stable condition and awaiting transport. 

## 2016-05-13 NOTE — Discharge Summary (Signed)
Physician Discharge Summary  [name] MRN: [id number] DOB/AGE: 03/04/1941 75 y.o.  PCP: Pcp Not In System   Admit date: 05/10/2016 Discharge date: 05/13/2016  Discharge Diagnoses:     Principal Problem:   Sepsis ([place]) Active Problems:   CAP (community acquired pneumonia)   Infection of urinary tract   Acute renal failure (HCC)   GERD (gastroesophageal reflux disease)   BPH (benign prostatic hyperplasia)   Abnormal ECG   Acute renal failure (ARF) (HCC) Escherichia coli urinary tract infection   Follow-up recommendations Follow-up with PCP in 3-5 days , including all  additional recommended appointments as below Follow-up CBC, CMP in 3-5 days        Current Discharge Medication List    START taking these medications   Details  albuterol (VENTOLIN HFA) 108 (90 Base) MCG/ACT inhaler Inhale 1 puff into the lungs every 6 (six) hours as needed for wheezing or shortness of breath. Qty: 1 Inhaler, Refills: 1    diltiazem (CARDIZEM CD) 120 MG 24 hr capsule Take 1 capsule (120 mg total) by mouth daily. Qty: 30 capsule, Refills: 2    furosemide (LASIX) 20 MG tablet Take 1 tablet (20 mg total) by mouth daily. Qty: 30 tablet, Refills: 1    guaiFENesin (MUCINEX) 600 MG 12 hr tablet Take 1 tablet (600 mg total) by mouth 2 (two) times daily. Qty: 20 tablet, Refills: 0    levofloxacin (LEVAQUIN) 750 MG tablet Take 1 tablet (750 mg total) by mouth every other day. Qty: 3 tablet, Refills: 0    pantoprazole (PROTONIX) 40 MG tablet Take 1 tablet (40 mg total) by mouth daily. Qty: 30 tablet, Refills: 1    predniSONE (DELTASONE) 20 MG tablet Take 2 tablets (40 mg total) by mouth daily with breakfast. Qty: 4 tablet, Refills: 0      CONTINUE these medications which have NOT CHANGED   Details  finasteride (PROSCAR) 5 MG tablet Take 5 mg by mouth at bedtime.    tamsulosin (FLOMAX) 0.4 MG CAPS capsule Take 1 capsule (0.4 mg total) by mouth daily. Qty: 30 capsule, Refills: 0          Discharge Condition: Stable   Discharge Instructions Get Medicines reviewed and adjusted: Please take all your medications with you for your next visit with your Primary MD  Please request your Primary MD to go over all hospital tests and procedure/radiological results at the follow up, please ask your Primary MD to get all Hospital records sent to his/her office.  If you experience worsening of your admission symptoms, develop shortness of breath, life threatening emergency, suicidal or homicidal thoughts you must seek medical attention immediately by calling 911 or calling your MD immediately if symptoms less severe.  You must read complete instructions/literature along with all the possible adverse reactions/side effects for all the Medicines you take and that have been prescribed to you. Take any new Medicines after you have completely understood and accpet all the possible adverse reactions/side effects.   Do not drive when taking Pain medications.   Do not take more than prescribed Pain, Sleep and Anxiety Medications  Special Instructions: If you have smoked or chewed Tobacco in the last 2 yrs please stop smoking, stop any regular Alcohol and or any Recreational drug use.  Wear Seat belts while driving.  Please note  You were cared for by a hospitalist during your hospital stay. Once you are discharged, your primary care physician will handle any further medical issues. Please note that  NO REFILLS for any discharge medications will be authorized once you are discharged, as it is imperative that you return to your primary care physician (or establish a relationship with a primary care physician if you do not have one) for your aftercare needs so that they can reassess your need for medications and monitor your lab values.     No Known Allergies    Disposition: 01-Home or Self Care   Consults:  None   Significant Diagnostic Studies:  Dg Chest 2  View  05/10/2016  CLINICAL DATA:  Cough for a few days. EXAM: CHEST  2 VIEW COMPARISON:  Radiographs 06/10/2007.  Chest CT 05/27/2007 FINDINGS: Chronic pleural-parenchymal scarring at the right lung apex, unchanged. There is new patchy opacity in the right mid lower lung zone. Increased bronchial thickening from prior. Heart is at the upper limits of normal in size. Background emphysema. No large pleural effusion. No pneumothorax. IMPRESSION: 1. Patchy opacity in the right mid lower lung zone, concerning for pneumonia. 2. Chronic right apical scarring, unchanged over the past 9 years. 3. Emphysema.  Increased bronchial thickening. Electronically Signed   By: Jeb Levering M.D.   On: 05/10/2016 22:21   US Renal  05/11/2016  CLINICAL DATA:  Acute onset of renal failure.  Initial encounter. EXAM: RENAL / URINARY TRACT ULTRASOUND COMPLETE COMPARISON:  Abdominal ultrasound performed 05/28/2007 FINDINGS: Right Kidney: Length: 10.5 cm. Echogenicity within normal limits. No mass or hydronephrosis visualized. Left Kidney: Length: 11.8 cm. Echogenicity within normal limits. A 2.2 cm cyst is noted at the upper pole of the left kidney. No hydronephrosis visualized. Bladder: Appears normal for degree of bladder distention. A significantly enlarged prostate is noted, measuring 7.4 cm in transverse dimension. IMPRESSION: 1. No evidence of hydronephrosis. 2. Small left renal cyst noted. 3. Significantly enlarged prostate noted. Electronically Signed   By: Garald Balding M.D.   On: 05/11/2016 01:40     2-D echo  LV EF: 55% - 60%  ------------------------------------------------------------------- Indications: Atrial fibrillation - 427.31.  ------------------------------------------------------------------- History: Risk factors: Former tobacco use.  ------------------------------------------------------------------- Study Conclusions  - Left ventricle: The cavity size was normal. Wall thickness  was  increased in a pattern of mild LVH. Systolic function was normal.  The estimated ejection fraction was in the range of 55% to 60%.  Wall motion was normal; there were no regional wall motion  abnormalities. Features are consistent with a pseudonormal left  ventricular filling pattern, with concomitant abnormal relaxation  and increased filling pressure (grade 2 diastolic dysfunction). - Aortic valve: Trileaflet; mildly thickened, mildly calcified  leaflets. - Pulmonary arteries: Systolic pressure was mildly increased. PA  peak pressure: 34 mm Hg (S).   Filed Weights   05/11/16 0205 05/12/16 0500 05/13/16 0500  Weight: 97.569 kg (215 lb 1.6 oz) 99.927 kg (220 lb 4.8 oz) 100.245 kg (221 lb)     Microbiology: Recent Results (from the past 240 hour(s))  Urine culture     Status: Abnormal   Collection Time: 05/10/16  9:53 PM  Result Value Ref Range Status   Specimen Description URINE, CLEAN CATCH  Final   Special Requests NONE  Final   Culture >=100,000 COLONIES/mL ESCHERICHIA COLI (A)  Final   Report Status 05/13/2016 FINAL  Final   Organism ID, Bacteria ESCHERICHIA COLI (A)  Final      Susceptibility   Escherichia coli - MIC*    AMPICILLIN <=2 SENSITIVE Sensitive     CEFAZOLIN <=4 SENSITIVE Sensitive     CEFTRIAXONE <=1  SENSITIVE Sensitive     CIPROFLOXACIN <=0.25 SENSITIVE Sensitive     GENTAMICIN <=1 SENSITIVE Sensitive     IMIPENEM <=0.25 SENSITIVE Sensitive     NITROFURANTOIN <=16 SENSITIVE Sensitive     TRIMETH/SULFA <=20 SENSITIVE Sensitive     AMPICILLIN/SULBACTAM <=2 SENSITIVE Sensitive     PIP/TAZO <=4 SENSITIVE Sensitive     * >=100,000 COLONIES/mL ESCHERICHIA COLI  Blood culture (routine x 2)     Status: None (Preliminary result)   Collection Time: 05/11/16 12:05 AM  Result Value Ref Range Status   Specimen Description BLOOD RIGHT FOREARM  Final   Special Requests BOTTLES DRAWN AEROBIC AND ANAEROBIC 5CC   Final   Culture NO GROWTH 1 DAY  Final    Report Status PENDING  Incomplete  Blood culture (routine x 2)     Status: None (Preliminary result)   Collection Time: 05/11/16  4:20 AM  Result Value Ref Range Status   Specimen Description BLOOD LEFT HAND  Final   Special Requests BOTTLES DRAWN AEROBIC AND ANAEROBIC 10CC  Final   Culture NO GROWTH 1 DAY  Final   Report Status PENDING  Incomplete  Culture, sputum-assessment     Status: None   Collection Time: 05/11/16  6:10 AM  Result Value Ref Range Status   Specimen Description SPUTUM  Final   Special Requests Immunocompromised  Final   Sputum evaluation   Final    MICROSCOPIC FINDINGS SUGGEST THAT THIS SPECIMEN IS NOT REPRESENTATIVE OF LOWER RESPIRATORY SECRETIONS. PLEASE RECOLLECT. Gram Stain Report Called to,Read Back By and Verified With: Otelia Sergeant AT 0824 05/11/16 BY L BENFIELD    Report Status 05/11/2016 FINAL  Final       Blood Culture    Component Value Date/Time   SDES SPUTUM 05/11/2016 0610   SPECREQUEST Immunocompromised 05/11/2016 0610   CULT NO GROWTH 1 DAY 05/11/2016 0420   REPTSTATUS 05/11/2016 FINAL 05/11/2016 0610      Labs: Results for orders placed or performed during the hospital encounter of 05/10/16 (from the past 48 hour(s))  TSH     Status: Abnormal   Collection Time: 05/11/16 10:52 AM  Result Value Ref Range   TSH 8.308 (H) 0.350 - 4.500 uIU/mL  Glucose, capillary     Status: Abnormal   Collection Time: 05/11/16 11:46 AM  Result Value Ref Range   Glucose-Capillary 304 (H) 65 - 99 mg/dL  Glucose, capillary     Status: Abnormal   Collection Time: 05/11/16  4:29 PM  Result Value Ref Range   Glucose-Capillary 382 (H) 65 - 99 mg/dL  Strep pneumoniae urinary antigen     Status: None   Collection Time: 05/11/16  6:59 PM  Result Value Ref Range   Strep Pneumo Urinary Antigen NEGATIVE NEGATIVE    Comment:        Infection due to S. pneumoniae cannot be absolutely ruled out since the antigen present may be below the detection limit of the  test.   Glucose, capillary     Status: Abnormal   Collection Time: 05/11/16  9:22 PM  Result Value Ref Range   Glucose-Capillary 292 (H) 65 - 99 mg/dL   Comment 1 Notify RN    Comment 2 Document in Chart   Glucose, capillary     Status: Abnormal   Collection Time: 05/12/16 12:25 AM  Result Value Ref Range   Glucose-Capillary 235 (H) 65 - 99 mg/dL   Comment 1 Notify RN    Comment 2 Document in Chart  CBC     Status: Abnormal   Collection Time: 05/12/16  2:53 AM  Result Value Ref Range   WBC 6.2 4.0 - 10.5 K/uL   RBC 3.85 (L) 4.22 - 5.81 MIL/uL   Hemoglobin 10.6 (L) 13.0 - 17.0 g/dL   HCT 33.8 (L) 39.0 - 52.0 %   MCV 87.8 78.0 - 100.0 fL   MCH 27.5 26.0 - 34.0 pg   MCHC 31.4 30.0 - 36.0 g/dL   RDW 14.7 11.5 - 15.5 %   Platelets 163 150 - 400 K/uL  Comprehensive metabolic panel     Status: Abnormal   Collection Time: 05/12/16  2:53 AM  Result Value Ref Range   Sodium 136 135 - 145 mmol/L   Potassium 4.0 3.5 - 5.1 mmol/L   Chloride 106 101 - 111 mmol/L   CO2 22 22 - 32 mmol/L   Glucose, Bld 213 (H) 65 - 99 mg/dL   BUN 31 (H) 6 - 20 mg/dL   Creatinine, Ser 1.71 (H) 0.61 - 1.24 mg/dL   Calcium 8.6 (L) 8.9 - 10.3 mg/dL   Total Protein 6.3 (L) 6.5 - 8.1 g/dL   Albumin 2.6 (L) 3.5 - 5.0 g/dL   AST 43 (H) 15 - 41 U/L   ALT 56 17 - 63 U/L   Alkaline Phosphatase 115 38 - 126 U/L   Total Bilirubin 0.7 0.3 - 1.2 mg/dL   GFR calc non Af Amer 37 (L) >60 mL/min   GFR calc Af Amer 43 (L) >60 mL/min    Comment: (NOTE) The eGFR has been calculated using the CKD EPI equation. This calculation has not been validated in all clinical situations. eGFR's persistently <60 mL/min signify possible Chronic Kidney Disease.    Anion gap 8 5 - 15  Glucose, capillary     Status: Abnormal   Collection Time: 05/12/16  4:57 AM  Result Value Ref Range   Glucose-Capillary 162 (H) 65 - 99 mg/dL  Glucose, capillary     Status: Abnormal   Collection Time: 05/12/16  8:20 AM  Result Value Ref Range    Glucose-Capillary 139 (H) 65 - 99 mg/dL  Glucose, capillary     Status: Abnormal   Collection Time: 05/12/16 11:53 AM  Result Value Ref Range   Glucose-Capillary 249 (H) 65 - 99 mg/dL  Glucose, capillary     Status: Abnormal   Collection Time: 05/12/16  4:42 PM  Result Value Ref Range   Glucose-Capillary 224 (H) 65 - 99 mg/dL  Glucose, capillary     Status: Abnormal   Collection Time: 05/12/16  8:28 PM  Result Value Ref Range   Glucose-Capillary 274 (H) 65 - 99 mg/dL  Glucose, capillary     Status: Abnormal   Collection Time: 05/13/16 12:28 AM  Result Value Ref Range   Glucose-Capillary 199 (H) 65 - 99 mg/dL  Glucose, capillary     Status: Abnormal   Collection Time: 05/13/16  5:55 AM  Result Value Ref Range   Glucose-Capillary 132 (H) 65 - 99 mg/dL  CBC     Status: Abnormal   Collection Time: 05/13/16  6:32 AM  Result Value Ref Range   WBC 7.7 4.0 - 10.5 K/uL   RBC 4.06 (L) 4.22 - 5.81 MIL/uL   Hemoglobin 11.1 (L) 13.0 - 17.0 g/dL   HCT 35.9 (L) 39.0 - 52.0 %   MCV 88.4 78.0 - 100.0 fL   MCH 27.3 26.0 - 34.0 pg   MCHC 30.9 30.0 -  36.0 g/dL   RDW 14.9 11.5 - 15.5 %   Platelets 177 150 - 400 K/uL  Comprehensive metabolic panel     Status: Abnormal   Collection Time: 05/13/16  6:32 AM  Result Value Ref Range   Sodium 140 135 - 145 mmol/L   Potassium 4.2 3.5 - 5.1 mmol/L   Chloride 111 101 - 111 mmol/L   CO2 22 22 - 32 mmol/L   Glucose, Bld 121 (H) 65 - 99 mg/dL   BUN 27 (H) 6 - 20 mg/dL   Creatinine, Ser 1.41 (H) 0.61 - 1.24 mg/dL   Calcium 8.7 (L) 8.9 - 10.3 mg/dL   Total Protein 6.0 (L) 6.5 - 8.1 g/dL   Albumin 2.4 (L) 3.5 - 5.0 g/dL   AST 92 (H) 15 - 41 U/L   ALT 107 (H) 17 - 63 U/L   Alkaline Phosphatase 126 38 - 126 U/L   Total Bilirubin 0.6 0.3 - 1.2 mg/dL   GFR calc non Af Amer 47 (L) >60 mL/min   GFR calc Af Amer 54 (L) >60 mL/min    Comment: (NOTE) The eGFR has been calculated using the CKD EPI equation. This calculation has not been validated in all  clinical situations. eGFR's persistently <60 mL/min signify possible Chronic Kidney Disease.    Anion gap 7 5 - 15  Glucose, capillary     Status: Abnormal   Collection Time: 05/13/16  7:58 AM  Result Value Ref Range   Glucose-Capillary 125 (H) 65 - 99 mg/dL   Comment 1 Notify RN    Comment 2 Document in Chart      Lipid Panel  No results found for: CHOL, TRIG, HDL, CHOLHDL, VLDL, LDLCALC, LDLDIRECT   Lab Results  Component Value Date   HGBA1C 6.2* 05/11/2016     Lab Results  Component Value Date   CREATININE 1.41* 05/13/2016   History of present illness  75 y.o. male who has a past medical history significant for BPH; who presents complaining of cough, shortness of breath, and acid reflux symptoms. Patient notes that he got caught outside in the rain 4 days ago (5/17). The following day he reports onset of a productive cough with thick green sputum production.Upon admission to the emergency department patient was seen to be febrile 102F with otherwise stable vital signs. Lab work revealed normal CBC, BUN of 35, creatinine 2.69, glucose of 237, and a troponin of 0.03. Chest x-ray revealed a right mid lower lobe pneumonia . Patient admitted for pneumonia and UTI  Hospital course Sepsis secondary to CAP (community acquired pneumonia)/: Patient presented with cough, congestion generalized weakness.   did have a fever up to 102.69F in the ED with elevated respiratory rate to 27. Chest x-ray reveals a right lower lobe pneumonia Developed transient atrial fibrillation and converted to normal sinus rhythm on telemetry - Follow-up lactic acid level normal, pro-calcitonin 2.27 , blood/sputum cultures no growth so far Started on empiric antibiotics of Rocephin and azithromycin , Now switched to Levaquin 3 more doses - Mucinex  Paroxysmal atrial fibrillation, currently normal sinus rhythm Likely in the setting of pulmonary process/Pneumonia  Started on PO Cardizem, change to  Cardizem CD TSH 8.3 Continue to cycle cardiac enzymes 2-D echowith results as above Patient found to have   chronic diastolic heart failure without exacerbation this admission Started on Lasix 20 mg once a day    Acute COPD exacerbation/emphysema: Patient with a significant remote history of smoking for 35 years of at  least 1-2 packs per day who quit in 1986. Physical exam tonight patient with bilateral wheezes appreciated. Currently weaned down to room air - Duonebs every 6 hours Inpatient, now provided with albuterol inhaler -started on IV Solu-Medrol and transitioned to low dose prednisone daily 4 more doses    Urinary tract infection: Acute. Patient with urinary frequency Urine culture shows Escherichia coli greater than 100,000 colonies - Antibiotics as seen above  Acute renal failure, chronic kidney disease stage II: Apparently baseline is 1.4  . Likely prerenal, renal ultrasound does not show any hydronephrosis, continue gentle IV hydration Renal function improving, 2.69> 1.71>1.4   Hyperglycemia: Prediabetes, Patient blood sugar as high as 236 on admission Hemoglobin A1c 6.2 patient treated with sliding scale insulin, recommend hemoglobin A1c check in 3-4 months  Transaminitis: Acute. Patient denies any alcohol use. Improving - Follow-up repeat CMP   GERD - protonix    Discharge Exam:    Blood pressure 125/83, pulse 75, temperature 97.9 F (36.6 C), temperature source Oral, resp. rate 18, height 6' 3"  (1.905 m), weight 100.245 kg (221 lb), SpO2 96 %.  General exam: Appears calm and comfortable  Respiratory system: Clear to auscultation. Respiratory effort normal. Cardiovascular system: S1 & S2 heard, RRR. No JVD, murmurs, rubs, gallops or clicks. No pedal edema. Gastrointestinal system: Abdomen is nondistended, soft and nontender. No organomegaly or masses felt. Normal bowel sounds heard. Central nervous system: Alert and oriented. No focal neurological  deficits. Extremities: Symmetric 5 x 5 power. Skin: No rashes, lesions or ulcers Psychiatry: Judgement and insight appear normal. Mood & affect appropriate.    Follow-up Information    Follow up with PCP. Schedule an appointment as soon as possible for a visit in 3 days.   Why:  Hospital follow-up      Signed: Reyne Dumas 05/13/2016, 8:33 AM        Time spent >45 mins

## 2016-05-13 NOTE — Progress Notes (Signed)
Inpatient Diabetes Program Recommendations  AACE/ADA: New Consensus Statement on Inpatient Glycemic Control (2015)  Target Ranges:  Prepandial:   less than 140 mg/dL      Peak postprandial:   less than 180 mg/dL (1-2 hours)      Critically ill patients:  140 - 180 mg/dL  Results for [name], [name] (MRN [id number]) as of 05/13/2016 07:44  Ref. Range 05/12/2016 08:20 05/12/2016 11:53 05/12/2016 16:42 05/12/2016 20:28 05/13/2016 00:28 05/13/2016 05:55  Glucose-Capillary Latest Ref Range: 65-99 mg/dL 409139 (H) 811249 (H) 914224 (H) 274 (H) 199 (H) 132 (H)   Review of Glycemic Control  Diabetes history: Prediabetes Outpatient Diabetes medications: None Current orders for Inpatient glycemic control: Novolog 0-9 units Q4H  Inpatient Diabetes Program Recommendations: Insulin - Meal Coverage: If steroids are continued as ordered (Prednisone 40 mg QAM), please consider ordering Novolog 4 units TID with meals for meal coverage (in addition to Novolog correction scale) if patient eats at least 50% of meal.  Thanks, Orlando PennerMarie Ricky Gallery, RN, MSN, CDE Diabetes Coordinator Inpatient Diabetes Program [phone] (Team Pager from 8am to 5pm) [phone] ([facility] office) [phone] [facility] office) [phone] [facility] office)

## 2016-05-13 NOTE — Care Management Important Message (Signed)
Important Message  Patient Details  Name: Kimberlee NearingKenneth A Meals MRN: [id number] Date of Birth: February 20, 1941   Medicare Important Message Given:  Yes    Bernadette HoitShoffner, Tel Hevia Coleman 05/13/2016, 9:18 AM

## 2016-05-15 ENCOUNTER — Encounter: Payer: Self-pay | Admitting: Family Medicine

## 2016-05-15 ENCOUNTER — Telehealth: Payer: Self-pay | Admitting: Family Medicine

## 2016-05-15 ENCOUNTER — Ambulatory Visit (INDEPENDENT_AMBULATORY_CARE_PROVIDER_SITE_OTHER): Payer: Medicare Other | Admitting: Family Medicine

## 2016-05-15 VITALS — BP 136/66 | HR 67 | Temp 98.4°F | Ht 72.0 in | Wt 223.6 lb

## 2016-05-15 DIAGNOSIS — I5030 Unspecified diastolic (congestive) heart failure: Secondary | ICD-10-CM | POA: Insufficient documentation

## 2016-05-15 DIAGNOSIS — I480 Paroxysmal atrial fibrillation: Secondary | ICD-10-CM | POA: Diagnosis not present

## 2016-05-15 DIAGNOSIS — J189 Pneumonia, unspecified organism: Secondary | ICD-10-CM | POA: Diagnosis not present

## 2016-05-15 DIAGNOSIS — R7989 Other specified abnormal findings of blood chemistry: Secondary | ICD-10-CM

## 2016-05-15 DIAGNOSIS — I5032 Chronic diastolic (congestive) heart failure: Secondary | ICD-10-CM

## 2016-05-15 DIAGNOSIS — R945 Abnormal results of liver function studies: Principal | ICD-10-CM

## 2016-05-15 DIAGNOSIS — N3001 Acute cystitis with hematuria: Secondary | ICD-10-CM

## 2016-05-15 DIAGNOSIS — N183 Chronic kidney disease, stage 3 unspecified: Secondary | ICD-10-CM | POA: Insufficient documentation

## 2016-05-15 DIAGNOSIS — D72829 Elevated white blood cell count, unspecified: Secondary | ICD-10-CM

## 2016-05-15 DIAGNOSIS — R946 Abnormal results of thyroid function studies: Secondary | ICD-10-CM | POA: Diagnosis not present

## 2016-05-15 LAB — COMPREHENSIVE METABOLIC PANEL
ALK PHOS: 175 U/L — AB (ref 39–117)
ALT: 122 U/L — AB (ref 0–53)
AST: 43 U/L — ABNORMAL HIGH (ref 0–37)
Albumin: 3.2 g/dL — ABNORMAL LOW (ref 3.5–5.2)
BILIRUBIN TOTAL: 0.8 mg/dL (ref 0.2–1.2)
BUN: 27 mg/dL — AB (ref 6–23)
CO2: 27 mEq/L (ref 19–32)
Calcium: 8.7 mg/dL (ref 8.4–10.5)
Chloride: 105 mEq/L (ref 96–112)
Creatinine, Ser: 1.5 mg/dL (ref 0.40–1.50)
GFR: 48.24 mL/min — ABNORMAL LOW (ref >60.00)
GLUCOSE: 136 mg/dL — AB (ref 70–99)
Potassium: 4.2 mEq/L (ref 3.5–5.1)
SODIUM: 142 meq/L (ref 135–145)
TOTAL PROTEIN: 5.9 g/dL — AB (ref 6.0–8.3)

## 2016-05-15 LAB — CBC
HCT: 40.7 % (ref 39.0–52.0)
HEMOGLOBIN: 13.4 g/dL (ref 13.0–17.0)
MCHC: 33.1 g/dL (ref 30.0–36.0)
MCV: 86.6 fl (ref 78.0–100.0)
Platelets: 254 10*3/uL (ref 150.0–400.0)
RBC: 4.7 Mil/uL (ref 4.22–5.81)
RDW: 15.6 % — AB (ref 11.5–15.5)
WBC: 14.2 10*3/uL — AB (ref 4.0–10.5)

## 2016-05-15 LAB — T4, FREE: Free T4: 0.56 ng/dL — ABNORMAL LOW (ref 0.60–1.60)

## 2016-05-15 LAB — T3, FREE: T3 FREE: 2.5 pg/mL (ref 2.3–4.2)

## 2016-05-15 MED ORDER — ASPIRIN EC 325 MG PO TBEC: 325.0000 mg | DELAYED_RELEASE_TABLET | Freq: Every day | ORAL | Status: DC

## 2016-05-15 NOTE — Assessment & Plan Note (Addendum)
Single episode of atrial fibrillation while in the hospital. He was placed on diltiazem. Patient's chads vasc score is 3. I discussed anticoagulation at length today and the risk of stroke with not being anticoagulated. Patient declined anticoagulation. He stated he would take aspirin and thus this was prescribed. Given this single episode and his refusal to take anticoagulation at this time we will refer him to cardiology for further evaluation. He is given return precautions.

## 2016-05-15 NOTE — Progress Notes (Signed)
Patient ID: [name], male   DOB: 1941/02/26, 75 y.o.   MRN: [id number]  Shawn Rumps, MD Phone: [phone]  [name] is a 75 y.o. male who presents today for new patient visit and hospital follow-up.  Patient was admitted to the hospital several days ago and found to have pneumonia and a UTI. He was started on Rocephin and azithromycin and then switched to Levaquin to finish his course. He was also found to have an Escherichia coli UTI. He notes no shortness of breath. No fever. Some cough though this is improved. Not productive. No abdominal pain. No dysuria. No urinary frequency. No swelling in his ankles. No orthopnea. No chest pain. No palpitations. He feels well at this time. They additionally found that he went into paroxysmal A. fib briefly and was in normal sinus rhythm at discharge. Not started on anticoagulation. Also found that he had diastolic heart failure and placed him on Lasix. He notes overall he is doing well since discharge. He was also noted to have acute renal failure with apparent kidney baseline of 1.4. Creatinine peaked at 2.69 and trended down to baseline. Also on prednisone.  Active Ambulatory Problems    Diagnosis Date Noted  . CAP (community acquired pneumonia) 05/11/2016  . Sepsis ([place]) 05/11/2016  . Infection of urinary tract 05/11/2016  . Acute renal failure ([place]) 05/11/2016  . GERD (gastroesophageal reflux disease) 05/11/2016  . BPH (benign prostatic hyperplasia) 05/11/2016  . Abnormal ECG   . Acute renal failure (ARF) ([place])   . Paroxysmal atrial fibrillation ([place]) 05/15/2016  . Diastolic heart failure ([place]) 05/15/2016  . CKD (chronic kidney disease), stage III 05/15/2016  . Elevated LFTs 05/15/2016   Resolved Ambulatory Problems    Diagnosis Date Noted  . No Resolved Ambulatory Problems   Past Medical History  Diagnosis Date  . Prostate enlargement   . Chickenpox     Family History  Problem Relation Age of Onset  . Prostate cancer       Social History   Social History  . Marital Status: Married    Spouse Name: N/A  . Number of Children: N/A  . Years of Education: N/A   Occupational History  . Not on file.   Social History Main Topics  . Smoking status: Former Research scientist (life sciences)  . Smokeless tobacco: Former Systems developer  . Alcohol Use: No  . Drug Use: No  . Sexual Activity: Not Currently   Other Topics Concern  . Not on file   Social History Narrative    ROS  General:  Negative for nexplained weight loss, fever Skin: Negative for new or changing mole, sore that won't heal HEENT: Negative for trouble hearing, trouble seeing, ringing in ears, mouth sores, hoarseness, change in voice, dysphagia. CV:  Negative for chest pain, dyspnea, edema, palpitations Resp: Positive for cough, negative dyspnea, hemoptysis GI: Negative for nausea, vomiting, diarrhea, constipation, abdominal pain, melena, hematochezia. GU: Negative for dysuria, incontinence, urinary hesitance, hematuria, vaginal or penile discharge, polyuria, sexual difficulty, lumps in testicle or breasts MSK: Negative for muscle cramps or aches, joint pain or swelling Neuro: Negative for headaches, weakness, numbness, dizziness, passing out/fainting Psych: Negative for depression, anxiety, memory problems Positive for frequent urination and excessive thirst  Objective  Physical Exam Filed Vitals:   05/15/16 0853  BP: 136/66  Pulse: 67  Temp: 98.4 F (36.9 C)    BP Readings from Last 3 Encounters:  05/15/16 136/66  05/13/16 125/83  06/30/14 139/80   Wt Readings from  Last 3 Encounters:  05/15/16 223 lb 9.6 oz (101.424 kg)  05/13/16 221 lb (100.245 kg)  06/30/14 210 lb (95.255 kg)    Physical Exam  Constitutional: He is well-developed, well-nourished, and in no distress.  HENT:  Head: Normocephalic and atraumatic.  Right Ear: External ear normal.  Left Ear: External ear normal.  Mouth/Throat: Oropharynx is clear and moist. No oropharyngeal exudate.    Eyes: Conjunctivae are normal. Pupils are equal, round, and reactive to light.  Neck: Neck supple.  Cardiovascular: Normal rate, regular rhythm and normal heart sounds.   Pulmonary/Chest: Effort normal.  Mild right lower lung field crackles, otherwise normal breath sounds  Abdominal: Soft. Bowel sounds are normal. He exhibits no distension. There is no tenderness. There is no rebound and no guarding.  Musculoskeletal: He exhibits no edema.  Lymphadenopathy:    He has no cervical adenopathy.  Neurological: He is alert. Gait normal.  Skin: Skin is warm and dry. He is not diaphoretic.  Psychiatric: Mood and affect normal.     Assessment/Plan:   CAP (community acquired pneumonia) Much improved. Breathing well. Afebrile and vital signs stable. He'll finish his course of antibiotics. Continue nebulizers at home. Continue to monitor. Given return precautions.  Infection of urinary tract Asymptomatic. Finish course of Levaquin. Given return precautions.  Paroxysmal atrial fibrillation (HCC) Single episode of atrial fibrillation while in the hospital. He was placed on diltiazem. Patient's chads vasc score is 3. I discussed anticoagulation at length today and the risk of stroke with not being anticoagulated. Patient declined anticoagulation. He stated he would take aspirin and thus this was prescribed. Given this single episode and his refusal to take anticoagulation at this time we will refer him to cardiology for further evaluation. He is given return precautions.  Diastolic heart failure ([place]) Patient found to have diastolic heart failure while in the hospital. Fluid status appears good at this time. He'll continue Lasix. He will continue to monitor. We'll refer to cardiology for further evaluation.  CKD (chronic kidney disease), stage III Patient with acute on chronic kidney injury on the hospital. We'll recheck a CMP today.  Elevated LFTs Noted while patient was in the hospital. Could  be related to acute infection. We will recheck these today.    Orders Placed This Encounter  Procedures  . CBC  . Comp Met (CMET)  . T3, free  . T4, free  . Ambulatory referral to Cardiology    Referral Priority:  Routine    Referral Type:  Consultation    Referral Reason:  Specialty Services Required    Requested Specialty:  Cardiology    Number of Visits Requested:  1    Meds ordered this encounter  Medications  . aspirin EC 325 MG tablet    Sig: Take 1 tablet (325 mg total) by mouth daily.    Dispense:  30 tablet    Refill:  [place], MD [place]

## 2016-05-15 NOTE — Patient Instructions (Signed)
Nice to meet you. I'm glad you're improved. We are going to refer you to cardiology for further discussion and evaluation regarding your A. fib and heart failure. Please complete your antibiotic course. We'll obtain some lab work today and call you with the results. If you develop shortness of breath, cough productive of blood, abdominal pain, burning with urination, chest pain, palpitations, or any new or changing symptoms please seek medical attention.

## 2016-05-15 NOTE — Telephone Encounter (Signed)
Called patient and informed him of his lab work results. Kidney function is relatively stable from previously. Liver function tests are somewhat similar previously and will need to be rechecked next week. If still elevated will need to consider further workup with ultrasound and hepatitis titers. White blood cell count elevated likely related to his prednisone. We'll plan to recheck this next week. We will have nursing call the patient to set up a lab appointment for late next week to recheck these things.

## 2016-05-15 NOTE — Assessment & Plan Note (Signed)
Patient with acute on chronic kidney injury on the hospital. We'll recheck a CMP today.

## 2016-05-15 NOTE — Assessment & Plan Note (Signed)
Patient found to have diastolic heart failure while in the hospital. Fluid status appears good at this time. He'll continue Lasix. He will continue to monitor. We'll refer to cardiology for further evaluation.

## 2016-05-15 NOTE — Assessment & Plan Note (Signed)
Asymptomatic. Finish course of Levaquin. Given return precautions.

## 2016-05-15 NOTE — Assessment & Plan Note (Addendum)
Much improved. Breathing well. Afebrile and vital signs stable. He'll finish his course of antibiotics. Continue nebulizers at home. Continue to monitor. Given return precautions.

## 2016-05-15 NOTE — Assessment & Plan Note (Signed)
Noted while patient was in the hospital. Could be related to acute infection. We will recheck these today.

## 2016-05-15 NOTE — Progress Notes (Signed)
Pre visit review using our clinic review tool, if applicable. No additional management support is needed unless otherwise documented below in the visit note. 

## 2016-05-16 LAB — CULTURE, BLOOD (ROUTINE X 2)
Culture: NO GROWTH
Culture: NO GROWTH

## 2016-05-18 NOTE — Telephone Encounter (Signed)
Tried to call patient to schedule lab appointment. Phone was busy

## 2016-05-19 NOTE — Telephone Encounter (Signed)
Scheduled lab appointment for Friday. 

## 2016-05-22 ENCOUNTER — Other Ambulatory Visit (INDEPENDENT_AMBULATORY_CARE_PROVIDER_SITE_OTHER): Payer: Medicare Other

## 2016-05-22 DIAGNOSIS — R945 Abnormal results of liver function studies: Principal | ICD-10-CM

## 2016-05-22 DIAGNOSIS — D72829 Elevated white blood cell count, unspecified: Secondary | ICD-10-CM | POA: Diagnosis not present

## 2016-05-22 DIAGNOSIS — R7989 Other specified abnormal findings of blood chemistry: Secondary | ICD-10-CM | POA: Diagnosis not present

## 2016-05-22 LAB — CBC
HCT: 39.4 % (ref 39.0–52.0)
Hemoglobin: 12.9 g/dL — ABNORMAL LOW (ref 13.0–17.0)
MCHC: 32.6 g/dL (ref 30.0–36.0)
MCV: 86.4 fl (ref 78.0–100.0)
Platelets: 232 10*3/uL (ref 150.0–400.0)
RBC: 4.57 Mil/uL (ref 4.22–5.81)
RDW: 15.7 % — AB (ref 11.5–15.5)
WBC: 6.7 10*3/uL (ref 4.0–10.5)

## 2016-05-22 LAB — COMPREHENSIVE METABOLIC PANEL
ALBUMIN: 3.3 g/dL — AB (ref 3.5–5.2)
ALK PHOS: 115 U/L (ref 39–117)
ALT: 37 U/L (ref 0–53)
AST: 21 U/L (ref 0–37)
BILIRUBIN TOTAL: 1.3 mg/dL — AB (ref 0.2–1.2)
BUN: 17 mg/dL (ref 6–23)
CO2: 28 mEq/L (ref 19–32)
Calcium: 8.6 mg/dL (ref 8.4–10.5)
Chloride: 102 mEq/L (ref 96–112)
Creatinine, Ser: 1.7 mg/dL — ABNORMAL HIGH (ref 0.40–1.50)
GFR: 41.75 mL/min — ABNORMAL LOW (ref >60.00)
GLUCOSE: 148 mg/dL — AB (ref 70–99)
Potassium: 4.2 mEq/L (ref 3.5–5.1)
SODIUM: 137 meq/L (ref 135–145)
TOTAL PROTEIN: 6.6 g/dL (ref 6.0–8.3)

## 2016-05-25 ENCOUNTER — Encounter: Payer: Self-pay | Admitting: *Deleted

## 2016-05-26 ENCOUNTER — Telehealth: Payer: Self-pay | Admitting: *Deleted

## 2016-05-26 NOTE — Telephone Encounter (Signed)
Attempted to call patient back, not able to leave a message, just kept ringing. Will try again.

## 2016-05-26 NOTE — Telephone Encounter (Signed)
Patient has requested lab results from 06/09 [phone]

## 2016-05-29 ENCOUNTER — Other Ambulatory Visit: Payer: Self-pay | Admitting: Family Medicine

## 2016-05-29 DIAGNOSIS — N189 Chronic kidney disease, unspecified: Secondary | ICD-10-CM

## 2016-05-29 NOTE — Telephone Encounter (Signed)
Tried a second time, no response, will close note till patient calls back. thanks

## 2016-06-01 ENCOUNTER — Other Ambulatory Visit (INDEPENDENT_AMBULATORY_CARE_PROVIDER_SITE_OTHER): Payer: Medicare Other

## 2016-06-01 DIAGNOSIS — N189 Chronic kidney disease, unspecified: Secondary | ICD-10-CM | POA: Diagnosis not present

## 2016-06-02 LAB — BASIC METABOLIC PANEL
BUN: 13 mg/dL (ref 6–23)
CALCIUM: 9.4 mg/dL (ref 8.4–10.5)
CO2: 25 meq/L (ref 19–32)
CREATININE: 1.72 mg/dL — AB (ref 0.40–1.50)
Chloride: 107 mEq/L (ref 96–112)
GFR: 41.19 mL/min — ABNORMAL LOW (ref >60.00)
Glucose, Bld: 101 mg/dL — ABNORMAL HIGH (ref 70–99)
Potassium: 4.3 mEq/L (ref 3.5–5.1)
Sodium: 140 mEq/L (ref 135–145)

## 2016-06-19 ENCOUNTER — Ambulatory Visit: Payer: Medicare Other | Admitting: Family Medicine

## 2016-06-19 DIAGNOSIS — Z0289 Encounter for other administrative examinations: Secondary | ICD-10-CM

## 2017-07-20 ENCOUNTER — Emergency Department (HOSPITAL_BASED_OUTPATIENT_CLINIC_OR_DEPARTMENT_OTHER)
Admission: EM | Admit: 2017-07-20 | Discharge: 2017-07-21 | Disposition: A | Discharge status: Home / Self Care | Payer: Medicare Other | Attending: Emergency Medicine | Admitting: Emergency Medicine

## 2017-07-20 ENCOUNTER — Encounter (HOSPITAL_BASED_OUTPATIENT_CLINIC_OR_DEPARTMENT_OTHER): Payer: Self-pay | Admitting: Emergency Medicine

## 2017-07-20 DIAGNOSIS — N183 Chronic kidney disease, stage 3 (moderate): Secondary | ICD-10-CM | POA: Diagnosis not present

## 2017-07-20 DIAGNOSIS — Z79899 Other long term (current) drug therapy: Secondary | ICD-10-CM | POA: Diagnosis not present

## 2017-07-20 DIAGNOSIS — R310 Gross hematuria: Secondary | ICD-10-CM | POA: Insufficient documentation

## 2017-07-20 DIAGNOSIS — Z87891 Personal history of nicotine dependence: Secondary | ICD-10-CM | POA: Diagnosis not present

## 2017-07-20 DIAGNOSIS — I5030 Unspecified diastolic (congestive) heart failure: Secondary | ICD-10-CM | POA: Diagnosis not present

## 2017-07-20 DIAGNOSIS — Z7982 Long term (current) use of aspirin: Secondary | ICD-10-CM | POA: Diagnosis not present

## 2017-07-20 DIAGNOSIS — R319 Hematuria, unspecified: Secondary | ICD-10-CM | POA: Diagnosis present

## 2017-07-20 HISTORY — DX: Diaphragmatic hernia without obstruction or gangrene: K44.9

## 2017-07-20 LAB — CBC WITH DIFFERENTIAL/PLATELET
BASOS ABS: 0 10*3/uL (ref 0.0–0.1)
Basophils Relative: 0 %
EOS PCT: 2 %
Eosinophils Absolute: 0.1 10*3/uL (ref 0.0–0.7)
HCT: 43.1 % (ref 39.0–52.0)
HEMOGLOBIN: 14.2 g/dL (ref 13.0–17.0)
LYMPHS ABS: 1.6 10*3/uL (ref 0.7–4.0)
LYMPHS PCT: 29 %
MCH: 29.4 pg (ref 26.0–34.0)
MCHC: 32.9 g/dL (ref 30.0–36.0)
MCV: 89.2 fL (ref 78.0–100.0)
Monocytes Absolute: 0.5 10*3/uL (ref 0.1–1.0)
Monocytes Relative: 9 %
NEUTROS PCT: 60 %
Neutro Abs: 3.4 10*3/uL (ref 1.7–7.7)
PLATELETS: 146 10*3/uL — AB (ref 150–400)
RBC: 4.83 MIL/uL (ref 4.22–5.81)
RDW: 14.7 % (ref 11.5–15.5)
WBC: 5.6 10*3/uL (ref 4.0–10.5)

## 2017-07-20 LAB — COMPREHENSIVE METABOLIC PANEL
ALK PHOS: 56 U/L (ref 38–126)
ALT: 21 U/L (ref 17–63)
AST: 25 U/L (ref 15–41)
Albumin: 4.2 g/dL (ref 3.5–5.0)
Anion gap: 8 (ref 5–15)
BUN: 19 mg/dL (ref 6–20)
CALCIUM: 9.3 mg/dL (ref 8.9–10.3)
CHLORIDE: 103 mmol/L (ref 101–111)
CO2: 26 mmol/L (ref 22–32)
CREATININE: 1.54 mg/dL — AB (ref 0.61–1.24)
GFR, EST AFRICAN AMERICAN: 48 mL/min — AB (ref >60)
GFR, EST NON AFRICAN AMERICAN: 41 mL/min — AB (ref >60)
Glucose, Bld: 90 mg/dL (ref 65–99)
Potassium: 4.4 mmol/L (ref 3.5–5.1)
SODIUM: 137 mmol/L (ref 135–145)
Total Bilirubin: 0.9 mg/dL (ref 0.3–1.2)
Total Protein: 7.4 g/dL (ref 6.5–8.1)

## 2017-07-20 LAB — URINALYSIS, ROUTINE W REFLEX MICROSCOPIC
Bilirubin Urine: NEGATIVE
Glucose, UA: NEGATIVE mg/dL
Ketones, ur: NEGATIVE mg/dL
NITRITE: NEGATIVE
PH: 6 (ref 5.0–8.0)
Protein, ur: 100 mg/dL — AB
Specific Gravity, Urine: 1.004 — ABNORMAL LOW (ref 1.005–1.030)

## 2017-07-20 LAB — URINALYSIS, MICROSCOPIC (REFLEX)

## 2017-07-20 NOTE — ED Triage Notes (Addendum)
Blood in urine x2 weeks intermittently.  Large clots started today.

## 2017-07-21 NOTE — ED Provider Notes (Signed)
[facility]   CSN: [id number] Arrival date & time: 07/20/17  2026     History   Chief Complaint Chief Complaint  Patient presents with  . Hematuria    HPI [name] is a 76 y.o. male.  HPI   76 year old male with history of paroxysmal atrial fibrillation, diastolic heart failure, CK D stage III, GERD, BPH presents with concern for hematuria. Patient reports she's had 2-3 weeks of hematuria, with significant worsening today. Reports that he's had 4 episodes today which were nearly gross blood. Reports that he had urine that started out as a normal urine stream and then had what clots that he passed, and on subsequent urination today had another episode that appeared to be all blood in the commode. Reports he had 2 of these. Reports on her arrival to the emergency department his urine has begun to clear. Family reports he has been drinking water and that hasn't seemed to help. Reports that his urine was brown in color since he's been here.  Denies difficulty urinating, dysuria, flank pain, abdominal pain. Reports there is an area low down to the left which has a little bit of pain with urination. He is not on blood thinners. He had no history of prior hematuria. No fevers. Family reports that he's had heartburn. Patient reports this is consistent with his normal heartburn, and is not concerned.  Past Medical History:  Diagnosis Date  . Chickenpox   . GERD (gastroesophageal reflux disease)   . Hiatal hernia   . Prostate enlargement     Patient Active Problem List   Diagnosis Date Noted  . Paroxysmal atrial fibrillation (HCC) 05/15/2016  . Diastolic heart failure (HCC) 05/15/2016  . CKD (chronic kidney disease), stage III 05/15/2016  . Elevated LFTs 05/15/2016  . CAP (community acquired pneumonia) 05/11/2016  . Sepsis (HCC) 05/11/2016  . Infection of urinary tract 05/11/2016  . Acute renal failure (HCC) 05/11/2016  . GERD (gastroesophageal reflux  disease) 05/11/2016  . BPH (benign prostatic hyperplasia) 05/11/2016  . Abnormal ECG   . Acute renal failure (ARF) (HCC)     Past Surgical History:  Procedure Laterality Date  . CATARACT EXTRACTION         Home Medications    Prior to Admission medications   Medication Sig Start Date End Date Taking? Authorizing Provider  albuterol (VENTOLIN HFA) 108 (90 Base) MCG/ACT inhaler Inhale 1 puff into the lungs every 6 (six) hours as needed for wheezing or shortness of breath. 05/13/16   Richarda Overlie, MD  aspirin EC 325 MG tablet Take 1 tablet (325 mg total) by mouth daily. 05/15/16   Glori Luis, MD  diltiazem (CARDIZEM CD) 120 MG 24 hr capsule Take 1 capsule (120 mg total) by mouth daily. 05/13/16   Richarda Overlie, MD  finasteride (PROSCAR) 5 MG tablet Take 5 mg by mouth at bedtime.    [provider]  furosemide (LASIX) 20 MG tablet Take 1 tablet (20 mg total) by mouth daily. 05/13/16   Richarda Overlie, MD  guaiFENesin (MUCINEX) 600 MG 12 hr tablet Take 1 tablet (600 mg total) by mouth 2 (two) times daily. 05/13/16   Richarda Overlie, MD  levofloxacin (LEVAQUIN) 750 MG tablet Take 1 tablet (750 mg total) by mouth every other day. 05/13/16   Richarda Overlie, MD  pantoprazole (PROTONIX) 40 MG tablet Take 1 tablet (40 mg total) by mouth daily. 05/13/16   Richarda Overlie, MD  predniSONE (DELTASONE) 20 MG tablet  Take 2 tablets (40 mg total) by mouth daily with breakfast. 05/13/16   Richarda Overlie, MD  tamsulosin (FLOMAX) 0.4 MG CAPS capsule Take 1 capsule (0.4 mg total) by mouth daily. 06/30/14   Raeford Razor, MD    Family History Family History  Problem Relation Age of Onset  . Prostate cancer Unknown     Social History Social History  Substance Use Topics  . Smoking status: Former Games developer  . Smokeless tobacco: Former Neurosurgeon  . Alcohol use No     Allergies   Patient has no known allergies.   Review of Systems Review of Systems  Constitutional: Negative for fever.  HENT:  Positive for dental problem.   Eyes: Negative for visual disturbance.  Respiratory: Negative for shortness of breath.   Cardiovascular: Negative for chest pain (heartburn).  Gastrointestinal: Negative for abdominal pain, nausea and vomiting.  Genitourinary: Positive for hematuria. Negative for difficulty urinating and dysuria.  Musculoskeletal: Negative for back pain and neck stiffness.  Skin: Negative for rash.  Neurological: Negative for syncope and light-headedness.     Physical Exam Updated Vital Signs BP 139/88 (BP Location: Right Arm)   Pulse 70   Temp 98.2 F (36.8 C)   Resp 16   Ht 6\' 2"  (1.88 m)   Wt 95.3 kg (210 lb)   SpO2 100%   BMI 26.96 kg/m   Physical Exam  Constitutional: He is oriented to person, place, and time. He appears well-developed and well-nourished. No distress.  HENT:  Head: Normocephalic and atraumatic.  Eyes: Conjunctivae and EOM are normal.  Neck: Normal range of motion.  Cardiovascular: Normal rate, regular rhythm, normal heart sounds and intact distal pulses.  Exam reveals no gallop and no friction rub.   No murmur heard. Pulmonary/Chest: Effort normal and breath sounds normal. No respiratory distress. He has no wheezes. He has no rales.  Abdominal: Soft. He exhibits no distension. There is no tenderness. There is no guarding.  Musculoskeletal: He exhibits no edema.  Neurological: He is alert and oriented to person, place, and time.  Skin: Skin is warm and dry. He is not diaphoretic.  Nursing note and vitals reviewed.    ED Treatments / Results  Labs (all labs ordered are listed, but only abnormal results are displayed) Labs Reviewed  URINALYSIS, ROUTINE W REFLEX MICROSCOPIC - Abnormal; Notable for the following:       Result Value   Color, Urine RED (*)    APPearance HAZY (*)    Specific Gravity, Urine 1.004 (*)    Hgb urine dipstick LARGE (*)    Protein, ur 100 (*)    Leukocytes, UA TRACE (*)    All other components within normal  limits  URINALYSIS, MICROSCOPIC (REFLEX) - Abnormal; Notable for the following:    Bacteria, UA RARE (*)    Squamous Epithelial / LPF 0-5 (*)    All other components within normal limits  CBC WITH DIFFERENTIAL/PLATELET - Abnormal; Notable for the following:    Platelets 146 (*)    All other components within normal limits  COMPREHENSIVE METABOLIC PANEL - Abnormal; Notable for the following:    Creatinine, Ser 1.54 (*)    GFR calc non Af Amer 41 (*)    GFR calc Af Amer 48 (*)    All other components within normal limits  TROPONIN I    EKG  EKG Interpretation None       Radiology No results found.  Procedures Procedures (including critical care time)  Medications Ordered  in ED Medications - No data to display   Initial Impression / Assessment and Plan / ED Course  I have reviewed the triage vital signs and the nursing notes.  Pertinent labs & imaging results that were available during my care of the patient were reviewed by me and considered in my medical decision making (see chart for details).    76 year old male with history of paroxysmal atrial fibrillation, diastolic heart failure, CK D stage III, GERD, BPH presents with concern for hematuria.  Urinalysis shows no signs of cystitis/UTI.  Normal vital signs, not on blood thinners, improving hematuria this evening with a hemoglobin of 14.2--given this, I feel he is appropriate for close outpatient urology follow-up. I sent a message to his urologist Dr. Berneice HeinrichManny, and recommended the patient call urology tomorrow to set up an appointment.  Of note, his family reports he has heartburn. EKG without acute abnormalities. Offered troponin testing but patient declines, reports it feels like his normal heartburn.  Discussed return precautions. Patient discharged in stable condition with understanding of reasons to return.   Final Clinical Impressions(s) / ED Diagnoses   Final diagnoses:  Gross hematuria    New  Prescriptions Discharge Medication List as of 07/21/2017 12:21 AM       Alvira MondaySchlossman, Ayaka Andes, MD 07/21/17 [phone]

## 2018-06-08 ENCOUNTER — Emergency Department (HOSPITAL_COMMUNITY)
Admission: EM | Admit: 2018-06-08 | Discharge: 2018-06-08 | Disposition: A | Discharge status: Home / Self Care | Payer: Medicare Other | Attending: Emergency Medicine | Admitting: Emergency Medicine

## 2018-06-08 ENCOUNTER — Other Ambulatory Visit: Payer: Self-pay

## 2018-06-08 ENCOUNTER — Encounter (HOSPITAL_COMMUNITY): Payer: Self-pay

## 2018-06-08 DIAGNOSIS — I130 Hypertensive heart and chronic kidney disease with heart failure and stage 1 through stage 4 chronic kidney disease, or unspecified chronic kidney disease: Secondary | ICD-10-CM | POA: Diagnosis not present

## 2018-06-08 DIAGNOSIS — I5032 Chronic diastolic (congestive) heart failure: Secondary | ICD-10-CM | POA: Insufficient documentation

## 2018-06-08 DIAGNOSIS — N183 Chronic kidney disease, stage 3 (moderate): Secondary | ICD-10-CM | POA: Diagnosis not present

## 2018-06-08 DIAGNOSIS — R339 Retention of urine, unspecified: Secondary | ICD-10-CM

## 2018-06-08 DIAGNOSIS — Z87891 Personal history of nicotine dependence: Secondary | ICD-10-CM | POA: Insufficient documentation

## 2018-06-08 LAB — COMPREHENSIVE METABOLIC PANEL
ALT: 38 U/L (ref 0–44)
ANION GAP: 8 (ref 5–15)
AST: 45 U/L — AB (ref 15–41)
Albumin: 3.9 g/dL (ref 3.5–5.0)
Alkaline Phosphatase: 57 U/L (ref 38–126)
BILIRUBIN TOTAL: 1.3 mg/dL — AB (ref 0.3–1.2)
BUN: 15 mg/dL (ref 8–23)
CHLORIDE: 109 mmol/L (ref 98–111)
CO2: 23 mmol/L (ref 22–32)
Calcium: 9.3 mg/dL (ref 8.9–10.3)
Creatinine, Ser: 1.54 mg/dL — ABNORMAL HIGH (ref 0.61–1.24)
GFR calc Af Amer: 48 mL/min — ABNORMAL LOW (ref >60)
GFR calc non Af Amer: 41 mL/min — ABNORMAL LOW (ref >60)
GLUCOSE: 109 mg/dL — AB (ref 70–99)
POTASSIUM: 4.2 mmol/L (ref 3.5–5.1)
SODIUM: 140 mmol/L (ref 135–145)
TOTAL PROTEIN: 7.1 g/dL (ref 6.5–8.1)

## 2018-06-08 LAB — URINALYSIS, ROUTINE W REFLEX MICROSCOPIC
BACTERIA UA: NONE SEEN
BILIRUBIN URINE: NEGATIVE
Glucose, UA: NEGATIVE mg/dL
KETONES UR: NEGATIVE mg/dL
LEUKOCYTES UA: NEGATIVE
NITRITE: NEGATIVE
PH: 5 (ref 5.0–8.0)
PROTEIN: NEGATIVE mg/dL
Specific Gravity, Urine: 1.009 (ref 1.005–1.030)

## 2018-06-08 LAB — CBC WITH DIFFERENTIAL/PLATELET
ABS IMMATURE GRANULOCYTES: 0 10*3/uL (ref 0.0–0.1)
BASOS ABS: 0 10*3/uL (ref 0.0–0.1)
Basophils Relative: 1 %
Eosinophils Absolute: 0 10*3/uL (ref 0.0–0.7)
Eosinophils Relative: 1 %
HEMATOCRIT: 44.4 % (ref 39.0–52.0)
HEMOGLOBIN: 14.1 g/dL (ref 13.0–17.0)
Immature Granulocytes: 0 %
LYMPHS ABS: 0.7 10*3/uL (ref 0.7–4.0)
LYMPHS PCT: 11 %
MCH: 28 pg (ref 26.0–34.0)
MCHC: 31.8 g/dL (ref 30.0–36.0)
MCV: 88.3 fL (ref 78.0–100.0)
MONO ABS: 0.5 10*3/uL (ref 0.1–1.0)
MONOS PCT: 9 %
NEUTROS ABS: 4.5 10*3/uL (ref 1.7–7.7)
Neutrophils Relative %: 78 %
Platelets: 166 10*3/uL (ref 150–400)
RBC: 5.03 MIL/uL (ref 4.22–5.81)
RDW: 14.5 % (ref 11.5–15.5)
WBC: 5.8 10*3/uL (ref 4.0–10.5)

## 2018-06-08 MED ORDER — TAMSULOSIN HCL 0.4 MG PO CAPS
0.4000 mg | ORAL_CAPSULE | Freq: Once | ORAL | Status: AC
  Administered 2018-06-08: 0.4 mg via ORAL
  Filled 2018-06-08: qty 1

## 2018-06-08 MED ORDER — TAMSULOSIN HCL 0.4 MG PO CAPS: 0.4000 mg | ORAL_CAPSULE | Freq: Every day | ORAL | 0 refills | Status: DC

## 2018-06-08 NOTE — ED Notes (Signed)
Leg bag given to pt on discharge

## 2018-06-08 NOTE — ED Provider Notes (Signed)
Emergency Department Provider Note   I have reviewed the triage vital signs and the nursing notes.   HISTORY  Chief Complaint Urinary Retention   HPI [name] is a 77 y.o. male with medical problems as documented below the presents the emergency department today secondary to recurrent urinary retention.  Patient states this happened to him a couple years ago.  He does have intermittent hematuria as well.  States he last voided around 0 300 this morning.  No back pain, numbness, weakness in his legs or recent urinary tract infection symptoms.  No other associated symptoms. No other associated or modifying symptoms.    Past Medical History:  Diagnosis Date  . Chickenpox   . GERD (gastroesophageal reflux disease)   . Hiatal hernia   . Prostate enlargement     Patient Active Problem List   Diagnosis Date Noted  . Paroxysmal atrial fibrillation (HCC) 05/15/2016  . Diastolic heart failure (HCC) 05/15/2016  . CKD (chronic kidney disease), stage III (HCC) 05/15/2016  . Elevated LFTs 05/15/2016  . CAP (community acquired pneumonia) 05/11/2016  . Sepsis (HCC) 05/11/2016  . Infection of urinary tract 05/11/2016  . Acute renal failure (HCC) 05/11/2016  . GERD (gastroesophageal reflux disease) 05/11/2016  . BPH (benign prostatic hyperplasia) 05/11/2016  . Abnormal ECG   . Acute renal failure (ARF) (HCC)     Past Surgical History:  Procedure Laterality Date  . CATARACT EXTRACTION      Current Outpatient Rx  . Order #: [id number] Class: Normal  . Order #: [id number] Class: Normal  . Order #: [id number] Class: Normal  . Order #: [id number] Class: Normal  . Order #: [id number] Class: Normal  . Order #: [id number] Class: Normal  . Order #: [id number] Class: Normal  . Order #: [id number] Class: Normal  . [START ON 06/09/2018] Order #: [id number] Class: Print    Allergies Patient has no known allergies.  Family History  Problem Relation Age of Onset  . Prostate cancer Unknown      Social History Social History   Tobacco Use  . Smoking status: Former Games developermoker  . Smokeless tobacco: Former Engineer, waterUser  Substance Use Topics  . Alcohol use: No  . Drug use: No    Review of Systems  All other systems negative except as documented in the HPI. All pertinent positives and negatives as reviewed in the HPI. ____________________________________________   PHYSICAL EXAM:  VITAL SIGNS: ED Triage Vitals  Enc Vitals Group     BP 06/08/18 1622 (!) 183/95     Pulse Rate 06/08/18 1622 78     Resp 06/08/18 1622 16     Temp 06/08/18 1622 97.8 F (36.6 C)     Temp Source 06/08/18 1622 Oral     SpO2 06/08/18 1622 96 %    Constitutional: Alert and oriented. Well appearing and in no acute distress. Eyes: Conjunctivae are normal. PERRL. EOMI. Head: Atraumatic. Nose: No congestion/rhinnorhea. Mouth/Throat: Mucous membranes are moist.  Oropharynx non-erythematous. Neck: No stridor.  No meningeal signs.   Cardiovascular: Normal rate, regular rhythm. Good peripheral circulation. Grossly normal heart sounds.   Respiratory: Normal respiratory effort.  No retractions. Lungs CTAB. Gastrointestinal: Soft and nontender. No distention.  Foley catheter in place, benign at this time. Musculoskeletal: No lower extremity tenderness nor edema. No gross deformities of extremities. Neurologic:  Normal speech and language. No gross focal neurologic deficits are appreciated.  Skin:  Skin is warm, dry and intact. No rash noted.   ____________________________________________   LABS (all labs ordered are listed, but  only abnormal results are displayed)  Labs Reviewed  URINALYSIS, ROUTINE W REFLEX MICROSCOPIC - Abnormal; Notable for the following components:      Result Value   Hgb urine dipstick SMALL (*)    All other components within normal limits  COMPREHENSIVE METABOLIC PANEL - Abnormal; Notable for the following components:   Glucose, Bld 109 (*)    Creatinine, Ser 1.54 (*)    AST 45  (*)    Total Bilirubin 1.3 (*)    GFR calc non Af Amer 41 (*)    GFR calc Af Amer 48 (*)    All other components within normal limits  CBC WITH DIFFERENTIAL/PLATELET   ____________________________________________   INITIAL IMPRESSION / ASSESSMENT AND PLAN / ED COURSE  Catheter placed prior to my evaluation and resolution of symptoms.  Will wait for urine to come back.  Does have history of kidney disease so we will check BMP as well.  Otherwise patient likely be stable for discharge.  Workup negative. Suspect BPH, as has h/o same. Off of finasteride, will start flomax. Stable for dc w/ pcp/uro follow up for catheter removal.  Pertinent labs & imaging results that were available during my care of the patient were reviewed by me and considered in my medical decision making (see chart for details).  ____________________________________________  FINAL CLINICAL IMPRESSION(S) / ED DIAGNOSES  Final diagnoses:  Urinary retention     MEDICATIONS GIVEN DURING THIS VISIT:  Medications  tamsulosin (FLOMAX) capsule 0.4 mg (0.4 mg Oral Given 06/08/18 1948)     NEW OUTPATIENT MEDICATIONS STARTED DURING THIS VISIT:  New Prescriptions   TAMSULOSIN (FLOMAX) 0.4 MG CAPS CAPSULE    Take 1 capsule (0.4 mg total) by mouth daily.    Note:  This note was prepared with assistance of Dragon voice recognition software. Occasional wrong-word or sound-a-like substitutions may have occurred due to the inherent limitations of voice recognition software.   Marily Memos, MD 06/08/18 1950

## 2018-06-08 NOTE — ED Provider Notes (Signed)
Patient placed in Quick Look pathway, seen and evaluated   Chief Complaint: urinary retntion  HPI:   No urination since 0300. Hx of acute retention. C/o severe urgency. Denies fever or chills  ROS: Lower abdominal distnsion (one)  Physical Exam:   Gen: No distress  Neuro: Awake and Alert  Skin: Warm    Focused Exam: Bladder distended past the umbilicus   Initiation of care has begun. The patient has been counseled on the process, plan, and necessity for staying for the completion/evaluation, and the remainder of the medical screening examination    Arthor CaptainHarris, Orman Matsumura, PA-C 06/08/18 1641    Mesner, Barbara CowerJason, MD 06/08/18 1714

## 2018-06-08 NOTE — ED Triage Notes (Signed)
Pt endorses urinary retention since last night 0300. Has hx of same. Hypertensive.

## 2018-06-15 ENCOUNTER — Emergency Department (HOSPITAL_COMMUNITY)
Admission: EM | Admit: 2018-06-15 | Discharge: 2018-06-15 | Disposition: A | Discharge status: Home / Self Care | Payer: Medicare Other | Attending: Emergency Medicine | Admitting: Emergency Medicine

## 2018-06-15 ENCOUNTER — Encounter (HOSPITAL_COMMUNITY): Payer: Self-pay | Admitting: *Deleted

## 2018-06-15 ENCOUNTER — Other Ambulatory Visit: Payer: Self-pay

## 2018-06-15 DIAGNOSIS — R339 Retention of urine, unspecified: Secondary | ICD-10-CM | POA: Diagnosis not present

## 2018-06-15 DIAGNOSIS — N183 Chronic kidney disease, stage 3 (moderate): Secondary | ICD-10-CM | POA: Diagnosis not present

## 2018-06-15 DIAGNOSIS — Z466 Encounter for fitting and adjustment of urinary device: Secondary | ICD-10-CM | POA: Diagnosis present

## 2018-06-15 DIAGNOSIS — Z87891 Personal history of nicotine dependence: Secondary | ICD-10-CM | POA: Diagnosis not present

## 2018-06-15 DIAGNOSIS — I5032 Chronic diastolic (congestive) heart failure: Secondary | ICD-10-CM | POA: Insufficient documentation

## 2018-06-15 DIAGNOSIS — Z79899 Other long term (current) drug therapy: Secondary | ICD-10-CM | POA: Insufficient documentation

## 2018-06-15 NOTE — ED Triage Notes (Signed)
Pt in with catheter in place that was inserted on 6/26, pt in today asking for it to be removed, he has not followed up with urology, pt has pink tinged urine in foley tubing and has blood covered briefs that he says are from this morning- pt reports the medication that they started him on makes him bleed. No distress noted, denies pain

## 2018-06-15 NOTE — ED Notes (Signed)
Scanned twice, 26 ml, 23 ml

## 2018-06-15 NOTE — ED Notes (Signed)
Foley catheter irrigation completed, clear urine returned- foley catheter d/c'd- red blood at penile tip- pt attempting to urinate at present.  Catheter was intact when discontinued.

## 2018-06-15 NOTE — ED Provider Notes (Addendum)
[facility] EMERGENCY DEPARTMENT Provider Note   CSN: [id number] Arrival date & time: 06/15/18  1230     History   Chief Complaint Chief Complaint  Patient presents with  . Hematuria    HPI [name] is a 77 y.o. male presenting for Foley catheter removal.  Patient states he was seen in the ER last week, 6/26, for urinary retention.  A Foley was placed at that time, he was told to follow-up with PCP and/or urology.  Patient has not followed up.  Foley still in place.  He states it has been draining mostly clear urine.  He denies significant pain, but states he is having irritation at the site of the Foley entrance.  He denies fevers, chills, nausea, vomiting, or abdominal pain.  He would like the Foley out today.  Past medical history significant for BPH, paroxysmal A. fib, CKD.   HPI  Past Medical History:  Diagnosis Date  . Chickenpox   . GERD (gastroesophageal reflux disease)   . Hiatal hernia   . Prostate enlargement     Patient Active Problem List   Diagnosis Date Noted  . Paroxysmal atrial fibrillation (HCC) 05/15/2016  . Diastolic heart failure (HCC) 05/15/2016  . CKD (chronic kidney disease), stage III (HCC) 05/15/2016  . Elevated LFTs 05/15/2016  . CAP (community acquired pneumonia) 05/11/2016  . Sepsis (HCC) 05/11/2016  . Infection of urinary tract 05/11/2016  . Acute renal failure (HCC) 05/11/2016  . GERD (gastroesophageal reflux disease) 05/11/2016  . BPH (benign prostatic hyperplasia) 05/11/2016  . Abnormal ECG   . Acute renal failure (ARF) (HCC)     Past Surgical History:  Procedure Laterality Date  . CATARACT EXTRACTION          Home Medications    Prior to Admission medications   Medication Sig Start Date End Date Taking? Authorizing Provider  albuterol (VENTOLIN HFA) 108 (90 Base) MCG/ACT inhaler Inhale 1 puff into the lungs every 6 (six) hours as needed for wheezing or shortness of breath. 05/13/16  Yes Richarda Overlie,  MD  bismuth subsalicylate (PEPTO BISMOL) 262 MG/15ML suspension Take 30 mLs by mouth as needed for indigestion.   Yes [provider]  ibuprofen (ADVIL,MOTRIN) 200 MG tablet Take 200 mg by mouth as needed for moderate pain.   Yes [provider]  tamsulosin (FLOMAX) 0.4 MG CAPS capsule Take 1 capsule (0.4 mg total) by mouth daily. 06/09/18  Yes Mesner, Barbara Cower, MD  thiamine (VITAMIN B-1) 100 MG tablet Take 100 mg by mouth daily.   Yes [provider]  vitamin B-12 (CYANOCOBALAMIN) 1000 MCG tablet Take 1,000 mcg by mouth daily.   Yes [provider]  aspirin EC 325 MG tablet Take 1 tablet (325 mg total) by mouth daily. Patient not taking: Reported on 06/15/2018 05/15/16   Glori Luis, MD  diltiazem (CARDIZEM CD) 120 MG 24 hr capsule Take 1 capsule (120 mg total) by mouth daily. Patient not taking: Reported on 06/15/2018 05/13/16   Richarda Overlie, MD  furosemide (LASIX) 20 MG tablet Take 1 tablet (20 mg total) by mouth daily. Patient not taking: Reported on 06/15/2018 05/13/16   Richarda Overlie, MD  guaiFENesin (MUCINEX) 600 MG 12 hr tablet Take 1 tablet (600 mg total) by mouth 2 (two) times daily. Patient not taking: Reported on 06/15/2018 05/13/16   Richarda Overlie, MD  pantoprazole (PROTONIX) 40 MG tablet Take 1 tablet (40 mg total) by mouth daily. Patient not taking: Reported on 06/15/2018 05/13/16  Richarda OverlieAbrol, Nayana, MD    Family History Family History  Problem Relation Age of Onset  . Prostate cancer Unknown     Social History Social History   Tobacco Use  . Smoking status: Former Games developermoker  . Smokeless tobacco: Former Engineer, waterUser  Substance Use Topics  . Alcohol use: No  . Drug use: No     Allergies   Tamsulosin   Review of Systems Review of Systems  Constitutional: Negative for fever.  Gastrointestinal: Negative for abdominal pain and vomiting.  Genitourinary:       Irritation from foley catheter  All other systems reviewed and are negative.    Physical  Exam Updated Vital Signs BP 116/77 (BP Location: Right Arm)   Pulse 65   Temp 98.3 F (36.8 C) (Oral)   Resp 16   SpO2 98%   Physical Exam  Constitutional: He is oriented to person, place, and time. He appears well-developed and well-nourished. No distress.  Elderly male in no acute distress  HENT:  Head: Normocephalic and atraumatic.  Eyes: EOM are normal.  Neck: Normal range of motion.  Cardiovascular: Normal rate, regular rhythm and intact distal pulses.  Pulmonary/Chest: Effort normal and breath sounds normal. No respiratory distress. He has no wheezes.  Abdominal: Soft. He exhibits no distension. There is no tenderness. There is no rebound and no guarding.  Musculoskeletal: Normal range of motion.  Neurological: He is alert and oriented to person, place, and time.  Skin: Skin is warm. No rash noted.  Psychiatric: He has a normal mood and affect.  Nursing note and vitals reviewed.    ED Treatments / Results  Labs (all labs ordered are listed, but only abnormal results are displayed) Labs Reviewed - No data to display  EKG None  Radiology No results found.  Procedures Procedures (including critical care time)  Medications Ordered in ED Medications - No data to display   Initial Impression / Assessment and Plan / ED Course  I have reviewed the triage vital signs and the nursing notes.  Pertinent labs & imaging results that were available during my care of the patient were reviewed by me and considered in my medical decision making (see chart for details).     Pt presenting for Foley catheter removal.  No complaints at this time.  Physical exam reassuring, afebrile, appears nontoxic.  Has mild trauma/irritation at site of Foley insertion.  Discussed risks of Foley removal including need for placing a new Foley due to urinary retention.  Patient states he understands, and would like the Foley removed today.  Foley removed, patient attempting to void.  Voiding  only very little.  Bladder scan shows 26, then 23 mL, likely source of patient's difficulty with urination.  Patient given multiple cups of water.  Mild bleeding noted from the penis, likely secondary to Foley.  Doubt this is due to kidney injury.  No dysuria.  No fevers or chills, doubt UTI. Case dicussed with attending, Dr. Clarene DukeLittle agrees to plan. Discussed importance of follow-up with primary care for further evaluation and management.  Strict return precautions given including signs of urinary retention and UTI. Patient states he will follow-up next week.  At this time, patient appears safe for discharge.   Patient states he understands agrees plan.  Final Clinical Impressions(s) / ED Diagnoses   Final diagnoses:  Encounter for Foley catheter removal    ED Discharge Orders    None       Alveria ApleyCaccavale, Aleka Twitty, PA-C 06/15/18 1734  Alveria Apley, PA-C 06/15/18 1735    Little, Ambrose Finland, MD 06/19/18 2024

## 2018-06-15 NOTE — Discharge Instructions (Addendum)
Continue taking home medications as prescribed. Make sure you are staying well-hydrated with water.  Your urine should be clear to pale yellow. Follow-up with your primary care doctor next week for further evaluation and management. Return to the emergency room if you develop fevers, vomiting, abdominal pain, inability to urinate, or any new or concerning symptoms.

## 2018-06-16 ENCOUNTER — Emergency Department (HOSPITAL_COMMUNITY)
Admission: EM | Admit: 2018-06-16 | Discharge: 2018-06-16 | Disposition: A | Discharge status: Home / Self Care | Payer: Medicare Other | Attending: Emergency Medicine | Admitting: Emergency Medicine

## 2018-06-16 ENCOUNTER — Other Ambulatory Visit: Payer: Self-pay

## 2018-06-16 ENCOUNTER — Emergency Department (HOSPITAL_COMMUNITY): Payer: Medicare Other

## 2018-06-16 ENCOUNTER — Encounter (HOSPITAL_COMMUNITY): Payer: Self-pay | Admitting: Emergency Medicine

## 2018-06-16 DIAGNOSIS — Z79899 Other long term (current) drug therapy: Secondary | ICD-10-CM | POA: Diagnosis not present

## 2018-06-16 DIAGNOSIS — Z87891 Personal history of nicotine dependence: Secondary | ICD-10-CM | POA: Diagnosis not present

## 2018-06-16 DIAGNOSIS — N183 Chronic kidney disease, stage 3 (moderate): Secondary | ICD-10-CM | POA: Insufficient documentation

## 2018-06-16 DIAGNOSIS — R6889 Other general symptoms and signs: Secondary | ICD-10-CM | POA: Insufficient documentation

## 2018-06-16 DIAGNOSIS — R310 Gross hematuria: Secondary | ICD-10-CM | POA: Insufficient documentation

## 2018-06-16 DIAGNOSIS — R4182 Altered mental status, unspecified: Secondary | ICD-10-CM | POA: Insufficient documentation

## 2018-06-16 LAB — CBC WITH DIFFERENTIAL/PLATELET
Abs Immature Granulocytes: 0 10*3/uL (ref 0.0–0.1)
BASOS ABS: 0 10*3/uL (ref 0.0–0.1)
Basophils Relative: 0 %
Eosinophils Absolute: 0 10*3/uL (ref 0.0–0.7)
Eosinophils Relative: 0 %
HCT: 44.8 % (ref 39.0–52.0)
HEMOGLOBIN: 13.9 g/dL (ref 13.0–17.0)
Immature Granulocytes: 1 %
LYMPHS PCT: 4 %
Lymphs Abs: 0.3 10*3/uL — ABNORMAL LOW (ref 0.7–4.0)
MCH: 28.2 pg (ref 26.0–34.0)
MCHC: 31 g/dL (ref 30.0–36.0)
MCV: 90.9 fL (ref 78.0–100.0)
Monocytes Absolute: 0.4 10*3/uL (ref 0.1–1.0)
Monocytes Relative: 6 %
NEUTROS PCT: 89 %
Neutro Abs: 5.8 10*3/uL (ref 1.7–7.7)
Platelets: 152 10*3/uL (ref 150–400)
RBC: 4.93 MIL/uL (ref 4.22–5.81)
RDW: 14.3 % (ref 11.5–15.5)
WBC: 6.5 10*3/uL (ref 4.0–10.5)

## 2018-06-16 LAB — BASIC METABOLIC PANEL
ANION GAP: 12 (ref 5–15)
BUN: 16 mg/dL (ref 8–23)
CO2: 23 mmol/L (ref 22–32)
Calcium: 9.4 mg/dL (ref 8.9–10.3)
Chloride: 102 mmol/L (ref 98–111)
Creatinine, Ser: 1.83 mg/dL — ABNORMAL HIGH (ref 0.61–1.24)
GFR, EST AFRICAN AMERICAN: 39 mL/min — AB (ref >60)
GFR, EST NON AFRICAN AMERICAN: 33 mL/min — AB (ref >60)
Glucose, Bld: 150 mg/dL — ABNORMAL HIGH (ref 70–99)
POTASSIUM: 4.7 mmol/L (ref 3.5–5.1)
SODIUM: 137 mmol/L (ref 135–145)

## 2018-06-16 LAB — I-STAT CREATININE, ED: Creatinine, Ser: 1.7 mg/dL — ABNORMAL HIGH (ref 0.61–1.24)

## 2018-06-16 LAB — URINALYSIS, ROUTINE W REFLEX MICROSCOPIC
Glucose, UA: 250 mg/dL — AB
Ketones, ur: NEGATIVE mg/dL
Leukocytes, UA: NEGATIVE
NITRITE: POSITIVE — AB
PH: 8.5 — AB (ref 5.0–8.0)
Protein, ur: >300 mg/dL — AB
Specific Gravity, Urine: 1.015 (ref 1.005–1.030)

## 2018-06-16 LAB — I-STAT CG4 LACTIC ACID, ED
Lactic Acid, Venous: 2.22 mmol/L (ref 0.5–1.9)
Lactic Acid, Venous: 1.2 mmol/L (ref 0.5–1.9)

## 2018-06-16 LAB — I-STAT TROPONIN, ED: TROPONIN I, POC: 0 ng/mL (ref 0.00–0.08)

## 2018-06-16 LAB — URINALYSIS, MICROSCOPIC (REFLEX)
SQUAMOUS EPITHELIAL / LPF: NONE SEEN (ref 0–5)
WBC, UA: NONE SEEN WBC/hpf (ref 0–5)

## 2018-06-16 MED ORDER — SODIUM CHLORIDE 0.9 % IV SOLN
1.0000 g | Freq: Once | INTRAVENOUS | Status: AC
  Administered 2018-06-16: 1 g via INTRAVENOUS
  Filled 2018-06-16: qty 10

## 2018-06-16 MED ORDER — SODIUM CHLORIDE 0.9 % IV BOLUS
1000.0000 mL | Freq: Once | INTRAVENOUS | Status: AC
  Administered 2018-06-16: 1000 mL via INTRAVENOUS

## 2018-06-16 MED ORDER — CEPHALEXIN 250 MG PO CAPS: 250.0000 mg | ORAL_CAPSULE | Freq: Four times a day (QID) | ORAL | 0 refills | Status: AC

## 2018-06-16 NOTE — Discharge Instructions (Addendum)
You are here in the emergency department for evaluation of passing gross blood in your urine which is been an ongoing problem for you.  There was also some concern that you may have an infection as you were having shaking chills and some possible confusion.  Your lab work was unremarkable and you were feeling better here after some IV fluids.  We are giving you some antibiotics to take and it will be important for you to try to stay well-hydrated and continue to urinate.  Please follow-up with your urologist and return if any worsening symptoms.

## 2018-06-16 NOTE — ED Notes (Signed)
Bladder scanned pt for 106 ml

## 2018-06-16 NOTE — ED Triage Notes (Addendum)
Pt had his foley removed yesterday, pt is able to urinate however he is bleeding from his penis.  Pt was shaking, chills, loss of bladder.  Pt is altered, not knowing what day it is (thinks it is Thanksgiving), confused about time and situation.

## 2018-06-16 NOTE — ED Provider Notes (Signed)
[facility] EMERGENCY DEPARTMENT Provider Note   CSN: [id number] Arrival date & time: 06/16/18  2005     History   Chief Complaint Chief Complaint  Patient presents with  . Altered Mental Status    HPI [name] is a 77 y.o. male.  He is brought in by his brother-in-law for shaking chills.  Patient was recently in the emergency department for hematuria which is been an ongoing problem for him and had a Foley catheter placed.  He was here yesterday and had the catheter removed.  He has been home and at baseline usually can take care of himself and drive.  He was talking with family on the phone but he was shaking so badly that he had to hang up the phone.  Family went over to find him that he had urinated on the floor and that he was having chills.  When I asked the patient he states he is having no problems and he wants to go home.  When I asked him what day it was he thought it was Thanksgiving.  Level 5 caveat secondary to altered mental status.  The history is provided by the patient and a relative.  Altered Mental Status   This is a new problem. Episode onset: unknown. The problem has not changed since onset.Associated symptoms include confusion. His past medical history does not include CVA.    Past Medical History:  Diagnosis Date  . Chickenpox   . GERD (gastroesophageal reflux disease)   . Hiatal hernia   . Prostate enlargement     Patient Active Problem List   Diagnosis Date Noted  . Paroxysmal atrial fibrillation (HCC) 05/15/2016  . Diastolic heart failure (HCC) 05/15/2016  . CKD (chronic kidney disease), stage III (HCC) 05/15/2016  . Elevated LFTs 05/15/2016  . CAP (community acquired pneumonia) 05/11/2016  . Sepsis (HCC) 05/11/2016  . Infection of urinary tract 05/11/2016  . Acute renal failure (HCC) 05/11/2016  . GERD (gastroesophageal reflux disease) 05/11/2016  . BPH (benign prostatic hyperplasia) 05/11/2016  . Abnormal ECG   . Acute  renal failure (ARF) (HCC)     Past Surgical History:  Procedure Laterality Date  . CATARACT EXTRACTION          Home Medications    Prior to Admission medications   Medication Sig Start Date End Date Taking? Authorizing Provider  albuterol (VENTOLIN HFA) 108 (90 Base) MCG/ACT inhaler Inhale 1 puff into the lungs every 6 (six) hours as needed for wheezing or shortness of breath. 05/13/16   Richarda Overlie, MD  aspirin EC 325 MG tablet Take 1 tablet (325 mg total) by mouth daily. Patient not taking: Reported on 06/15/2018 05/15/16   Glori Luis, MD  bismuth subsalicylate (PEPTO BISMOL) 262 MG/15ML suspension Take 30 mLs by mouth as needed for indigestion.    [provider]  diltiazem (CARDIZEM CD) 120 MG 24 hr capsule Take 1 capsule (120 mg total) by mouth daily. Patient not taking: Reported on 06/15/2018 05/13/16   Richarda Overlie, MD  furosemide (LASIX) 20 MG tablet Take 1 tablet (20 mg total) by mouth daily. Patient not taking: Reported on 06/15/2018 05/13/16   Richarda Overlie, MD  guaiFENesin (MUCINEX) 600 MG 12 hr tablet Take 1 tablet (600 mg total) by mouth 2 (two) times daily. Patient not taking: Reported on 06/15/2018 05/13/16   Richarda Overlie, MD  ibuprofen (ADVIL,MOTRIN) 200 MG tablet Take 200 mg by mouth as needed for moderate pain.  [provider]  pantoprazole (PROTONIX) 40 MG tablet Take 1 tablet (40 mg total) by mouth daily. Patient not taking: Reported on 06/15/2018 05/13/16   Richarda Overlie, MD  tamsulosin (FLOMAX) 0.4 MG CAPS capsule Take 1 capsule (0.4 mg total) by mouth daily. 06/09/18   Mesner, Barbara Cower, MD  thiamine (VITAMIN B-1) 100 MG tablet Take 100 mg by mouth daily.    [provider]  vitamin B-12 (CYANOCOBALAMIN) 1000 MCG tablet Take 1,000 mcg by mouth daily.    [provider]    Family History Family History  Problem Relation Age of Onset  . Prostate cancer Unknown     Social History Social History   Tobacco Use  . Smoking  status: Former Games developer  . Smokeless tobacco: Former Engineer, water Use Topics  . Alcohol use: No  . Drug use: No     Allergies   Tamsulosin   Review of Systems Review of Systems  Unable to perform ROS: Mental status change  Constitutional: Positive for chills. Negative for fever.  HENT: Negative for rhinorrhea and sore throat.   Eyes: Negative for visual disturbance.  Respiratory: Negative for cough and shortness of breath.   Cardiovascular: Negative for chest pain.  Gastrointestinal: Negative for abdominal pain and nausea.  Genitourinary: Positive for difficulty urinating and hematuria.  Musculoskeletal: Negative for back pain.  Skin: Negative for rash.  Neurological: Negative for headaches.  Psychiatric/Behavioral: Positive for confusion.     Physical Exam Updated Vital Signs BP 111/76 (BP Location: Right Arm)   Pulse 85   Temp 98.9 F (37.2 C) (Oral)   Resp 20   Ht 6\' 2"  (1.88 m)   Wt 86.2 kg (190 lb)   SpO2 94%   BMI 24.39 kg/m   Physical Exam  Constitutional: He appears well-developed and well-nourished.  HENT:  Head: Normocephalic and atraumatic.  Right Ear: External ear normal.  Left Ear: External ear normal.  Nose: Nose normal.  Mouth/Throat: Oropharynx is clear and moist.  Eyes: Pupils are equal, round, and reactive to light. Conjunctivae and EOM are normal. Right eye exhibits no discharge. Left eye exhibits no discharge.  Neck: Neck supple.  Cardiovascular: Normal rate, regular rhythm, normal heart sounds and intact distal pulses.  Pulmonary/Chest: Effort normal and breath sounds normal.  Scattered ronchi  Abdominal: Soft. There is tenderness (suprapubic). There is no rebound and no guarding.  Genitourinary:  Genitourinary Comments: Patient is having some blood from his urethral meatus.  Musculoskeletal: Normal range of motion. He exhibits no tenderness or deformity.  Neurological: He is alert. He has normal strength. He is disoriented (To time).  No sensory deficit. GCS eye subscore is 4. GCS verbal subscore is 5. GCS motor subscore is 6.  Patient is moving all 4 extremities without any difficulty. When reasked the date he said it was July 4th.   Skin: Skin is warm and dry. Capillary refill takes less than 2 seconds.  Psychiatric: He has a normal mood and affect.  Nursing note and vitals reviewed.    ED Treatments / Results  Labs (all labs ordered are listed, but only abnormal results are displayed) Labs Reviewed  BASIC METABOLIC PANEL - Abnormal; Notable for the following components:      Result Value   Glucose, Bld 150 (*)    Creatinine, Ser 1.83 (*)    GFR calc non Af Amer 33 (*)    GFR calc Af Amer 39 (*)    All other components within normal limits  CBC WITH DIFFERENTIAL/PLATELET - Abnormal; Notable for the following components:   Lymphs Abs 0.3 (*)    All other components within normal limits  URINALYSIS, ROUTINE W REFLEX MICROSCOPIC - Abnormal; Notable for the following components:   Color, Urine RED (*)    APPearance TURBID (*)    pH 8.5 (*)    Glucose, UA 250 (*)    Hgb urine dipstick LARGE (*)    Bilirubin Urine SMALL (*)    Protein, ur >300 (*)    Nitrite POSITIVE (*)    All other components within normal limits  URINALYSIS, MICROSCOPIC (REFLEX) - Abnormal; Notable for the following components:   Bacteria, UA RARE (*)    All other components within normal limits  I-STAT CREATININE, ED - Abnormal; Notable for the following components:   Creatinine, Ser 1.70 (*)    All other components within normal limits  I-STAT CG4 LACTIC ACID, ED - Abnormal; Notable for the following components:   Lactic Acid, Venous 2.22 (*)    All other components within normal limits  CULTURE, BLOOD (ROUTINE X 2)  CULTURE, BLOOD (ROUTINE X 2)  URINE CULTURE  I-STAT TROPONIN, ED    EKG EKG Interpretation  Date/Time:  Thursday June 16 2018 20:49:14 EDT Ventricular Rate:  85 PR Interval:    QRS Duration: 81 QT  Interval:  327 QTC Calculation: 389 R Axis:   25 Text Interpretation:  Sinus rhythm Low voltage, precordial leads similar to prior 8/18 Confirmed by Meridee ScoreButler, Andreus Cure [phone]([id number]) on 06/16/2018 8:51:18 PM   Radiology Dg Chest Port 1 View  Result Date: 06/16/2018 CLINICAL DATA:  Chills and cough EXAM: PORTABLE CHEST 1 VIEW COMPARISON:  05/10/2016, CT chest 05/26/2017, radiograph 06/10/2007 FINDINGS: 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 Minimal streaky atelectasis at the left base. Borderline to mild cardiomegaly. No pneumothorax. Emphysematous disease. IMPRESSION: No active disease. Similar appearance of chronic pleural and parenchymal disease at the right apex. Emphysema. Electronically Signed   By: Jasmine PangKim  Fujinaga M.D.   On: 06/16/2018 21:04    Procedures Procedures (including critical care time)  Medications Ordered in ED Medications  sodium chloride 0.9 % bolus 1,000 mL (0 mLs Intravenous Stopped 06/16/18 2201)  cefTRIAXone (ROCEPHIN) 1 g in sodium chloride 0.9 % 100 mL IVPB (0 g Intravenous Stopped 06/16/18 2208)     Initial Impression / Assessment and Plan / ED Course  I have reviewed the triage vital signs and the nursing notes.  Pertinent labs & imaging results that were available during my care of the patient were reviewed by me and considered in my medical decision making (see chart for details).  Clinical Course as of Jun 17 852  Thu Jun 16, 2018  2032 Bladder scan patient had only 100 cc.  Have changed him from getting a Foley catheter to in and out cath.   [MB]  2119 he had an in and out cath by  nurse and she said it was mostly blood although free-flowing.  Currently he is denying any complaints now states it is July 4.   [MB]  2129 Patient feeling better making jokes for the nurses.  He is lactate is elevated 2.2 he is got some chronic renal insufficiency unchanged from his baseline.  He has had no fever here and is got no white count.  We are giving him  some fluids and a dose of ceftriaxone.   [MB]  2200 Patient was able to void spontaneously on his own after the catheterization  still passing blood and some clots but no evidence of retention.  His urine is nitrite positive so I feel that this is likely the source of his elevated lactate.  We are to recheck that.  if he  clears that, I  think he can go home on oral antibiotics.  He was E. coli positive a couple years ago and Keflex should be reasonable coverage for that per his last sensitivities.  Patient is very agreeable to go home.   [MB]  2225 Repeat lactate improved and patient wants to go home. Family willing to take him home, state he is at baseline.    [MB]    Clinical Course User Index [MB] Terrilee Files, MD     Final Clinical Impressions(s) / ED Diagnoses   Final diagnoses:  Gross hematuria  Rigors    ED Discharge Orders        Ordered    cephALEXin (KEFLEX) 250 MG capsule  4 times daily     06/16/18 2151       Terrilee Files, MD 06/17/18 [phone]

## 2018-06-17 NOTE — ED Notes (Signed)
[name] from lab called and reported a positive blood culture result  Notified EDP [name] that wanted the pt to be called   Pt called  Pt reported he was feeling better and stronger today  Pt then hung up

## 2018-06-18 LAB — BLOOD CULTURE ID PANEL (REFLEXED)
ACINETOBACTER BAUMANNII: NOT DETECTED
CANDIDA ALBICANS: NOT DETECTED
CANDIDA TROPICALIS: NOT DETECTED
Candida glabrata: NOT DETECTED
Candida krusei: NOT DETECTED
Candida parapsilosis: NOT DETECTED
ENTEROCOCCUS SPECIES: NOT DETECTED
Enterobacter cloacae complex: NOT DETECTED
Enterobacteriaceae species: NOT DETECTED
Escherichia coli: NOT DETECTED
HAEMOPHILUS INFLUENZAE: NOT DETECTED
Klebsiella oxytoca: NOT DETECTED
Klebsiella pneumoniae: NOT DETECTED
Listeria monocytogenes: NOT DETECTED
METHICILLIN RESISTANCE: NOT DETECTED
NEISSERIA MENINGITIDIS: NOT DETECTED
Proteus species: NOT DETECTED
Pseudomonas aeruginosa: NOT DETECTED
SERRATIA MARCESCENS: NOT DETECTED
STAPHYLOCOCCUS AUREUS BCID: NOT DETECTED
STREPTOCOCCUS PYOGENES: NOT DETECTED
STREPTOCOCCUS SPECIES: NOT DETECTED
Staphylococcus species: DETECTED — AB
Streptococcus agalactiae: NOT DETECTED
Streptococcus pneumoniae: NOT DETECTED

## 2018-06-19 ENCOUNTER — Emergency Department (HOSPITAL_COMMUNITY)
Admission: EM | Admit: 2018-06-19 | Discharge: 2018-06-19 | Disposition: A | Discharge status: Home / Self Care | Payer: Medicare Other | Attending: Emergency Medicine | Admitting: Emergency Medicine

## 2018-06-19 ENCOUNTER — Encounter (HOSPITAL_COMMUNITY): Payer: Self-pay | Admitting: Emergency Medicine

## 2018-06-19 DIAGNOSIS — N183 Chronic kidney disease, stage 3 (moderate): Secondary | ICD-10-CM | POA: Insufficient documentation

## 2018-06-19 DIAGNOSIS — I480 Paroxysmal atrial fibrillation: Secondary | ICD-10-CM | POA: Insufficient documentation

## 2018-06-19 DIAGNOSIS — R1084 Generalized abdominal pain: Secondary | ICD-10-CM | POA: Insufficient documentation

## 2018-06-19 DIAGNOSIS — I509 Heart failure, unspecified: Secondary | ICD-10-CM | POA: Diagnosis not present

## 2018-06-19 DIAGNOSIS — R339 Retention of urine, unspecified: Secondary | ICD-10-CM | POA: Diagnosis present

## 2018-06-19 LAB — URINALYSIS, ROUTINE W REFLEX MICROSCOPIC
BILIRUBIN URINE: NEGATIVE
Glucose, UA: NEGATIVE mg/dL
Ketones, ur: NEGATIVE mg/dL
Leukocytes, UA: NEGATIVE
Nitrite: NEGATIVE
Protein, ur: 100 mg/dL — AB
SPECIFIC GRAVITY, URINE: 1.008 (ref 1.005–1.030)
pH: 6 (ref 5.0–8.0)

## 2018-06-19 LAB — URINE CULTURE
Culture: >=100000 — AB
SPECIAL REQUESTS: NORMAL

## 2018-06-19 NOTE — ED Provider Notes (Signed)
[facility] EMERGENCY DEPARTMENT Provider Note   CSN: [id number] Arrival date & time: 06/19/18  1630     History   Chief Complaint Chief Complaint  Patient presents with  . Urinary Retention    HPI [name] is a 77 y.o. male.  Patient states that he is having abdominal pain because he cannot urinate.  He has not urinated since this morning.  He had a Foley taken out recently and is being treated for urinary tract infection  The history is provided by the patient. No language interpreter was used.  Abdominal Pain   This is a new problem. The current episode started 12 to 24 hours ago. The problem occurs constantly. The problem has not changed since onset.The pain is associated with an unknown factor. The pain is located in the generalized abdominal region. The quality of the pain is aching. Pertinent negatives include diarrhea, frequency, hematuria and headaches.    Past Medical History:  Diagnosis Date  . Chickenpox   . GERD (gastroesophageal reflux disease)   . Hiatal hernia   . Prostate enlargement     Patient Active Problem List   Diagnosis Date Noted  . Paroxysmal atrial fibrillation (HCC) 05/15/2016  . Diastolic heart failure (HCC) 05/15/2016  . CKD (chronic kidney disease), stage III (HCC) 05/15/2016  . Elevated LFTs 05/15/2016  . CAP (community acquired pneumonia) 05/11/2016  . Sepsis (HCC) 05/11/2016  . Infection of urinary tract 05/11/2016  . Acute renal failure (HCC) 05/11/2016  . GERD (gastroesophageal reflux disease) 05/11/2016  . BPH (benign prostatic hyperplasia) 05/11/2016  . Abnormal ECG   . Acute renal failure (ARF) (HCC)     Past Surgical History:  Procedure Laterality Date  . CATARACT EXTRACTION          Home Medications    Prior to Admission medications   Medication Sig Start Date End Date Taking? Authorizing Provider  albuterol (VENTOLIN HFA) 108 (90 Base) MCG/ACT inhaler Inhale 1 puff into the lungs every 6  (six) hours as needed for wheezing or shortness of breath. 05/13/16   Richarda Overlie, MD  aspirin EC 325 MG tablet Take 1 tablet (325 mg total) by mouth daily. Patient not taking: Reported on 06/15/2018 05/15/16   Glori Luis, MD  bismuth subsalicylate (PEPTO BISMOL) 262 MG/15ML suspension Take 30 mLs by mouth as needed for indigestion.    [provider]  cephALEXin (KEFLEX) 250 MG capsule Take 1 capsule (250 mg total) by mouth 4 (four) times daily. 06/16/18   Terrilee Files, MD  diltiazem (CARDIZEM CD) 120 MG 24 hr capsule Take 1 capsule (120 mg total) by mouth daily. Patient not taking: Reported on 06/15/2018 05/13/16   Richarda Overlie, MD  furosemide (LASIX) 20 MG tablet Take 1 tablet (20 mg total) by mouth daily. Patient not taking: Reported on 06/15/2018 05/13/16   Richarda Overlie, MD  guaiFENesin (MUCINEX) 600 MG 12 hr tablet Take 1 tablet (600 mg total) by mouth 2 (two) times daily. Patient not taking: Reported on 06/15/2018 05/13/16   Richarda Overlie, MD  ibuprofen (ADVIL,MOTRIN) 200 MG tablet Take 200 mg by mouth as needed for moderate pain.    [provider]  pantoprazole (PROTONIX) 40 MG tablet Take 1 tablet (40 mg total) by mouth daily. Patient not taking: Reported on 06/15/2018 05/13/16   Richarda Overlie, MD  tamsulosin (FLOMAX) 0.4 MG CAPS capsule Take 1 capsule (0.4 mg total) by mouth daily. 06/09/18   Mesner, Barbara Cower, MD  thiamine (  VITAMIN B-1) 100 MG tablet Take 100 mg by mouth daily.    [provider]  vitamin B-12 (CYANOCOBALAMIN) 1000 MCG tablet Take 1,000 mcg by mouth daily.    [provider]    Family History Family History  Problem Relation Age of Onset  . Prostate cancer Unknown     Social History Social History   Tobacco Use  . Smoking status: Former Games developermoker  . Smokeless tobacco: Former Engineer, waterUser  Substance Use Topics  . Alcohol use: No  . Drug use: No     Allergies   Tamsulosin   Review of Systems Review of Systems  Constitutional:  Negative for appetite change and fatigue.  HENT: Negative for congestion, ear discharge and sinus pressure.   Eyes: Negative for discharge.  Respiratory: Negative for cough.   Cardiovascular: Negative for chest pain.  Gastrointestinal: Positive for abdominal pain. Negative for diarrhea.  Genitourinary: Negative for frequency and hematuria.       Urinary retention  Musculoskeletal: Negative for back pain.  Skin: Negative for rash.  Neurological: Negative for seizures and headaches.  Psychiatric/Behavioral: Negative for hallucinations.     Physical Exam Updated Vital Signs BP (!) 141/96   Pulse 86   Temp 98.5 F (36.9 C) (Oral)   Resp (!) 22   SpO2 95%   Physical Exam  Constitutional: He is oriented to person, place, and time. He appears well-developed.  HENT:  Head: Normocephalic.  Eyes: Conjunctivae and EOM are normal. No scleral icterus.  Neck: Neck supple. No thyromegaly present.  Cardiovascular: Normal rate and regular rhythm. Exam reveals no gallop and no friction rub.  No murmur heard. Pulmonary/Chest: No stridor. He has no wheezes. He has no rales. He exhibits no tenderness.  Abdominal: He exhibits distension. There is tenderness. There is no rebound.  Musculoskeletal: Normal range of motion. He exhibits no edema.  Lymphadenopathy:    He has no cervical adenopathy.  Neurological: He is oriented to person, place, and time. He exhibits normal muscle tone. Coordination normal.  Skin: No rash noted. No erythema.  Psychiatric: He has a normal mood and affect. His behavior is normal.     ED Treatments / Results  Labs (all labs ordered are listed, but only abnormal results are displayed) Labs Reviewed  URINALYSIS, ROUTINE W REFLEX MICROSCOPIC - Abnormal; Notable for the following components:      Result Value   Color, Urine RED (*)    APPearance CLOUDY (*)    Hgb urine dipstick LARGE (*)    Protein, ur 100 (*)    RBC / HPF >50 (*)    Bacteria, UA RARE (*)    All  other components within normal limits  URINE CULTURE    EKG None  Radiology No results found.  Procedures Procedures (including critical care time)  Medications Ordered in ED Medications - No data to display   Initial Impression / Assessment and Plan / ED Course  I have reviewed the triage vital signs and the nursing notes.  Pertinent labs & imaging results that were available during my care of the patient were reviewed by me and considered in my medical decision making (see chart for details).     Shows urinary retention.  Foley placed without problems.  Urinalysis shows UTI.  Patient had a culture 3 days ago that showed his infection is sensitive to Keflex.  He will continue the Keflex and leave the Foley and follow-up with urology  Final Clinical Impressions(s) / ED Diagnoses  Final diagnoses:  Urinary retention    ED Discharge Orders    None       Bethann Berkshire, MD 06/19/18 2119

## 2018-06-19 NOTE — ED Triage Notes (Signed)
Pt presents to ED for assessment for urinary retention since 330am.  Patient states when he bears down really hard he squirts out his rectum, but it does not appear to be feces.  Patient c/o lower abdominal pain (distended and painful to palpation), testicular and penile pain.  Recent hx of gross hematuria, and recently had a catheter removed.

## 2018-06-19 NOTE — Discharge Instructions (Addendum)
Continue taking your antibiotic and follow-up with your urologist later this week

## 2018-06-20 ENCOUNTER — Telehealth: Payer: Self-pay | Admitting: Emergency Medicine

## 2018-06-20 LAB — CULTURE, BLOOD (ROUTINE X 2): Special Requests: ADEQUATE

## 2018-06-20 NOTE — Telephone Encounter (Signed)
Post ED Visit - Positive Culture Follow-up  Culture report reviewed by antimicrobial stewardship pharmacist:  []  Enzo BiNathan Batchelder, Pharm.D. []  Celedonio MiyamotoJeremy Frens, Pharm.D., BCPS AQ-ID []  Garvin FilaMike Maccia, Pharm.D., BCPS []  Georgina PillionElizabeth Martin, Pharm.D., BCPS []  FitzgeraldMinh Pham, VermontPharm.D., BCPS, AAHIVP []  Estella HuskMichelle Turner, Pharm.D., BCPS, AAHIVP []  Lysle Pearlachel Rumbarger, PharmD, BCPS []  Phillips Climeshuy Dang, PharmD, BCPS [x]  Agapito GamesAlison Masters, PharmD, BCPS []  Verlan FriendsErin Deja, PharmD  Positive urine culture Treated with cephalexin, organism sensitive to the same and no further patient follow-up is required at this time.  [name], [name] 06/20/2018, 11:05 AM

## 2018-06-21 ENCOUNTER — Telehealth: Payer: Self-pay | Admitting: Emergency Medicine

## 2018-06-21 LAB — URINE CULTURE: Culture: NO GROWTH

## 2018-06-21 LAB — CULTURE, BLOOD (ROUTINE X 2)
Culture: NO GROWTH
SPECIAL REQUESTS: ADEQUATE

## 2018-06-21 NOTE — Telephone Encounter (Signed)
Post ED Visit - Positive Culture Follow-up  Culture report reviewed by antimicrobial stewardship pharmacist:  []  Enzo BiNathan Batchelder, Pharm.D. []  Celedonio MiyamotoJeremy Frens, Pharm.D., BCPS AQ-ID []  Garvin FilaMike Maccia, Pharm.D., BCPS []  Georgina PillionElizabeth Martin, [address]Ph[address]D., BCPS []  GreencastleMinh Pham, [address]Pharm[address]D., BCPS, AAHIVP []  Estella HuskMichelle Turner, Pharm.D., BCPS, AAHIVP []  Lysle Pearlachel Rumbarger, PharmD, BCPS []  Phillips Climeshuy Dang, PharmD, BCPS [x]  Agapito GamesAlison Masters, PharmD, BCPS []  Verlan FriendsErin Deja, PharmD  Positive blood culture Likely contaminant, no further patient follow-up is required at this time.  [name], Aitana Burry 06/21/2018, 1:34 PM

## 2018-10-16 IMAGING — DX DG CHEST 1V PORT
1 series · 1 of 1 positions shown · non-contrast
Comparison: 05/10/2016, CT chest 05/26/2017, radiograph 06/10/2007

CLINICAL DATA: Chills and cough

EXAM:
PORTABLE CHEST 1 VIEW

[chest ap]
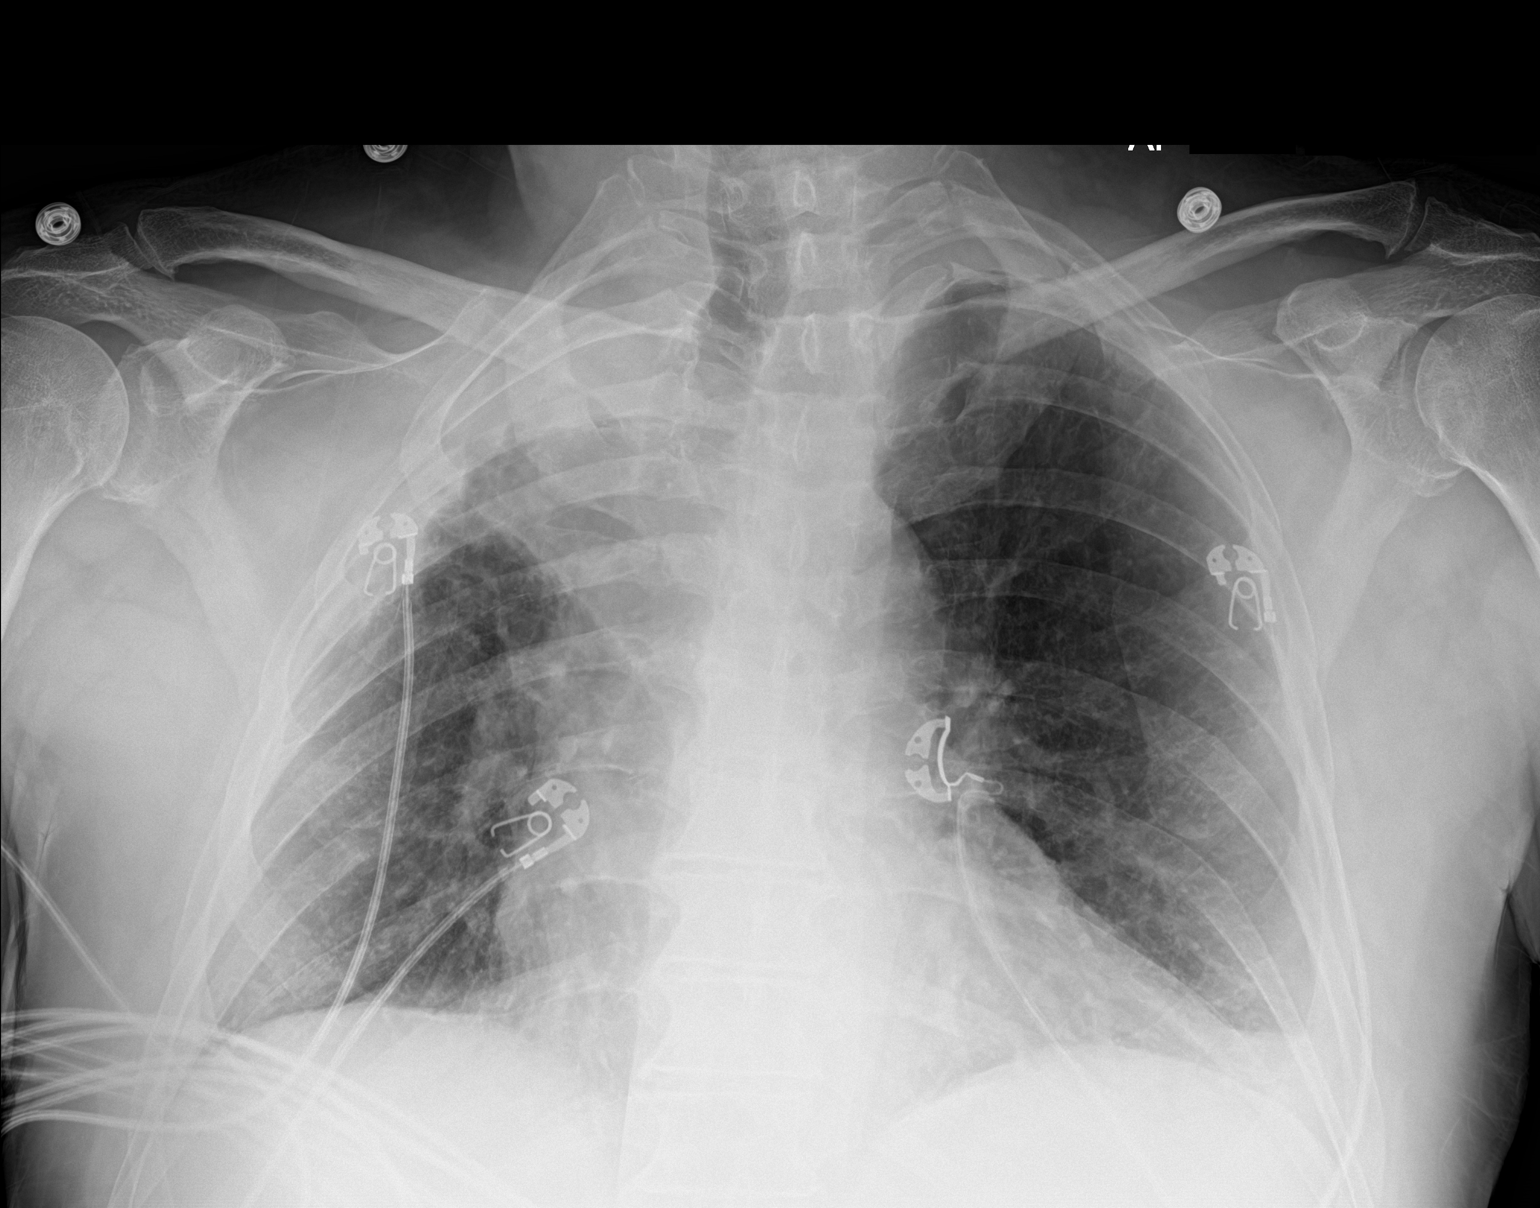

[1 of 1 positions shown; findings below may reference images not displayed]

FINDINGS: Chronic pleural and parenchymal disease at the right lung apex.
Minimal streaky atelectasis at the left base. Borderline to mild
cardiomegaly. No pneumothorax. Emphysematous disease.
IMPRESSION: No active disease. Similar appearance of chronic pleural and
parenchymal disease at the right apex. Emphysema.

## 2020-10-07 ENCOUNTER — Other Ambulatory Visit: Payer: Self-pay

## 2020-10-07 ENCOUNTER — Inpatient Hospital Stay
Admission: EM | Admit: 2020-10-07 | Discharge: 2020-10-14 | DRG: 177 | Disposition: E | Payer: Medicare Other | Attending: Pulmonary Disease | Admitting: Pulmonary Disease

## 2020-10-07 ENCOUNTER — Emergency Department: Payer: Medicare Other

## 2020-10-07 DIAGNOSIS — Z8619 Personal history of other infectious and parasitic diseases: Secondary | ICD-10-CM

## 2020-10-07 DIAGNOSIS — R7401 Elevation of levels of liver transaminase levels: Secondary | ICD-10-CM | POA: Diagnosis present

## 2020-10-07 DIAGNOSIS — N179 Acute kidney failure, unspecified: Secondary | ICD-10-CM

## 2020-10-07 DIAGNOSIS — K219 Gastro-esophageal reflux disease without esophagitis: Secondary | ICD-10-CM | POA: Diagnosis present

## 2020-10-07 DIAGNOSIS — Z66 Do not resuscitate: Secondary | ICD-10-CM | POA: Diagnosis present

## 2020-10-07 DIAGNOSIS — Z515 Encounter for palliative care: Secondary | ICD-10-CM

## 2020-10-07 DIAGNOSIS — Z888 Allergy status to other drugs, medicaments and biological substances status: Secondary | ICD-10-CM

## 2020-10-07 DIAGNOSIS — J159 Unspecified bacterial pneumonia: Secondary | ICD-10-CM | POA: Diagnosis present

## 2020-10-07 DIAGNOSIS — G9341 Metabolic encephalopathy: Secondary | ICD-10-CM | POA: Diagnosis present

## 2020-10-07 DIAGNOSIS — R7989 Other specified abnormal findings of blood chemistry: Secondary | ICD-10-CM | POA: Diagnosis present

## 2020-10-07 DIAGNOSIS — Z79899 Other long term (current) drug therapy: Secondary | ICD-10-CM

## 2020-10-07 DIAGNOSIS — K449 Diaphragmatic hernia without obstruction or gangrene: Secondary | ICD-10-CM | POA: Diagnosis present

## 2020-10-07 DIAGNOSIS — Z9849 Cataract extraction status, unspecified eye: Secondary | ICD-10-CM

## 2020-10-07 DIAGNOSIS — E872 Acidosis: Secondary | ICD-10-CM | POA: Diagnosis present

## 2020-10-07 DIAGNOSIS — J1282 Pneumonia due to coronavirus disease 2019: Secondary | ICD-10-CM | POA: Diagnosis present

## 2020-10-07 DIAGNOSIS — I724 Aneurysm of artery of lower extremity: Secondary | ICD-10-CM | POA: Diagnosis present

## 2020-10-07 DIAGNOSIS — R630 Anorexia: Secondary | ICD-10-CM | POA: Diagnosis present

## 2020-10-07 DIAGNOSIS — U071 COVID-19: Secondary | ICD-10-CM | POA: Diagnosis not present

## 2020-10-07 DIAGNOSIS — N183 Chronic kidney disease, stage 3 unspecified: Secondary | ICD-10-CM | POA: Diagnosis present

## 2020-10-07 DIAGNOSIS — N400 Enlarged prostate without lower urinary tract symptoms: Secondary | ICD-10-CM | POA: Diagnosis present

## 2020-10-07 DIAGNOSIS — R0902 Hypoxemia: Secondary | ICD-10-CM | POA: Diagnosis not present

## 2020-10-07 DIAGNOSIS — Z6826 Body mass index (BMI) 26.0-26.9, adult: Secondary | ICD-10-CM

## 2020-10-07 DIAGNOSIS — J9601 Acute respiratory failure with hypoxia: Secondary | ICD-10-CM | POA: Diagnosis present

## 2020-10-07 DIAGNOSIS — Z87891 Personal history of nicotine dependence: Secondary | ICD-10-CM

## 2020-10-07 LAB — BLOOD GAS, VENOUS
Acid-base deficit: 2.1 mmol/L — ABNORMAL HIGH (ref 0.0–2.0)
Bicarbonate: 22.4 mmol/L (ref 20.0–28.0)
FIO2: 1
O2 Saturation: 11 %
Patient temperature: 37
pCO2, Ven: 37 mmHg — ABNORMAL LOW (ref 44.0–60.0)
pH, Ven: 7.39 (ref 7.250–7.430)
pO2, Ven: <31 mmHg — CL (ref 32.0–45.0)

## 2020-10-07 LAB — COMPREHENSIVE METABOLIC PANEL
ALT: 61 U/L — ABNORMAL HIGH (ref 0–44)
AST: 175 U/L — ABNORMAL HIGH (ref 15–41)
Albumin: 4.2 g/dL (ref 3.5–5.0)
Alkaline Phosphatase: 83 U/L (ref 38–126)
Anion gap: 19 — ABNORMAL HIGH (ref 5–15)
BUN: 54 mg/dL — ABNORMAL HIGH (ref 8–23)
CO2: 19 mmol/L — ABNORMAL LOW (ref 22–32)
Calcium: 9.3 mg/dL (ref 8.9–10.3)
Chloride: 105 mmol/L (ref 98–111)
Creatinine, Ser: 2.29 mg/dL — ABNORMAL HIGH (ref 0.61–1.24)
GFR, Estimated: 28 mL/min — ABNORMAL LOW (ref >60)
Glucose, Bld: 179 mg/dL — ABNORMAL HIGH (ref 70–99)
Potassium: 4.2 mmol/L (ref 3.5–5.1)
Sodium: 143 mmol/L (ref 135–145)
Total Bilirubin: 2.6 mg/dL — ABNORMAL HIGH (ref 0.3–1.2)
Total Protein: 8.3 g/dL — ABNORMAL HIGH (ref 6.5–8.1)

## 2020-10-07 LAB — BRAIN NATRIURETIC PEPTIDE: B Natriuretic Peptide: 50 pg/mL (ref 0.0–100.0)

## 2020-10-07 LAB — PROTIME-INR
INR: 1 (ref 0.8–1.2)
Prothrombin Time: 12.8 seconds (ref 11.4–15.2)

## 2020-10-07 LAB — FIBRIN DERIVATIVES D-DIMER (ARMC ONLY): Fibrin derivatives D-dimer (ARMC): 4271.79 ng/mL (FEU) — ABNORMAL HIGH (ref 0.00–499.00)

## 2020-10-07 LAB — CBC WITH DIFFERENTIAL/PLATELET
Abs Immature Granulocytes: 0.04 10*3/uL (ref 0.00–0.07)
Basophils Absolute: 0 10*3/uL (ref 0.0–0.1)
Basophils Relative: 0 %
Eosinophils Absolute: 0 10*3/uL (ref 0.0–0.5)
Eosinophils Relative: 0 %
HCT: 46.6 % (ref 39.0–52.0)
Hemoglobin: 15.2 g/dL (ref 13.0–17.0)
Immature Granulocytes: 1 %
Lymphocytes Relative: 10 %
Lymphs Abs: 0.5 10*3/uL — ABNORMAL LOW (ref 0.7–4.0)
MCH: 28.8 pg (ref 26.0–34.0)
MCHC: 32.6 g/dL (ref 30.0–36.0)
MCV: 88.4 fL (ref 80.0–100.0)
Monocytes Absolute: 0.4 10*3/uL (ref 0.1–1.0)
Monocytes Relative: 9 %
Neutro Abs: 3.9 10*3/uL (ref 1.7–7.7)
Neutrophils Relative %: 80 %
Platelets: 144 10*3/uL — ABNORMAL LOW (ref 150–400)
RBC: 5.27 MIL/uL (ref 4.22–5.81)
RDW: 14.8 % (ref 11.5–15.5)
WBC: 4.8 10*3/uL (ref 4.0–10.5)
nRBC: 0 % (ref 0.0–0.2)

## 2020-10-07 LAB — RESPIRATORY PANEL BY RT PCR (FLU A&B, COVID)
Influenza A by PCR: NEGATIVE
Influenza B by PCR: NEGATIVE
SARS Coronavirus 2 by RT PCR: POSITIVE — AB

## 2020-10-07 LAB — PROCALCITONIN: Procalcitonin: 0.95 ng/mL

## 2020-10-07 LAB — TROPONIN I (HIGH SENSITIVITY): Troponin I (High Sensitivity): 36 ng/L — ABNORMAL HIGH (ref <18)

## 2020-10-07 LAB — LACTIC ACID, PLASMA: Lactic Acid, Venous: 6.1 mmol/L (ref 0.5–1.9)

## 2020-10-07 LAB — AMMONIA: Ammonia: 19 umol/L (ref 9–35)

## 2020-10-07 MED ORDER — AZITHROMYCIN 500 MG IV SOLR
500.0000 mg | Freq: Once | INTRAVENOUS | Status: AC
  Administered 2020-10-07: 500 mg via INTRAVENOUS
  Filled 2020-10-07: qty 500

## 2020-10-07 MED ORDER — SODIUM CHLORIDE 0.9 % IV BOLUS: 500.0000 mL | Freq: Once | INTRAVENOUS | Status: DC

## 2020-10-07 MED ORDER — SODIUM CHLORIDE 0.9 % IV SOLN
2.0000 g | Freq: Once | INTRAVENOUS | Status: AC
  Administered 2020-10-07: 2 g via INTRAVENOUS
  Filled 2020-10-07: qty 20

## 2020-10-07 MED ORDER — SODIUM CHLORIDE 0.9 % IV BOLUS
1000.0000 mL | Freq: Once | INTRAVENOUS | Status: AC
  Administered 2020-10-07: 1000 mL via INTRAVENOUS

## 2020-10-07 MED ORDER — METHYLPREDNISOLONE SODIUM SUCC 125 MG IJ SOLR
87.0000 mg | Freq: Once | INTRAMUSCULAR | Status: AC
  Administered 2020-10-07: 87 mg via INTRAVENOUS
  Filled 2020-10-07: qty 2

## 2020-10-07 NOTE — ED Notes (Signed)
ICU NP provider at bedside for evaluation

## 2020-10-07 NOTE — ED Notes (Addendum)
Dr Erma Heritage at bedside with care nurse Ellin Goodie RN; RT paged to room

## 2020-10-07 NOTE — ED Notes (Signed)
Pt to room 25 via w/c; assisted into hosp gown & on card monitor; NRB placed at 15l/min to bring sat from 60% to 83%

## 2020-10-07 NOTE — ED Provider Notes (Signed)
[facility] Emergency Department Provider Note  ____________________________________________   First MD Initiated Contact with Patient 10/01/2020 2206     (approximate)  I have reviewed the triage vital signs and the nursing notes.   HISTORY  Chief Complaint Shortness of Breath    HPI [name] is a 79 y.o. male  Here with SOB, AMS. Pt brought in by family. Per report, pt has been sick for "two weeks" with cough, SOB. He states he has felt "awful" but is somewhat confused and unable to provide additional history. He states he feels "a little better" after being placed on oxygen. No known h/o hypoxia.       Spoke with [name], daughter-in-law, with his son (in car): Pt started feeling sick on 10/13 running a fever, then started feeling weak with poor appetite. For the past 2 weeks, he's had fever, chills, poor appetite, SOB. Worsening over 2 weeks. Tonight, family came to visit him again and he was confused, hypoxic. Family checked and pulse ox was 64%, which made him finally come.   Past Medical History:  Diagnosis Date  . Chickenpox   . GERD (gastroesophageal reflux disease)   . Hiatal hernia   . Prostate enlargement     Patient Active Problem List   Diagnosis Date Noted  . Pneumonia due to COVID-19 virus 10/08/2020  . Elevated d-dimer   . Lactic acidosis   . Elevated transaminase level   . Paroxysmal atrial fibrillation (HCC) 05/15/2016  . Diastolic heart failure (HCC) 05/15/2016  . CKD (chronic kidney disease), stage III (HCC) 05/15/2016  . Elevated LFTs 05/15/2016  . CAP (community acquired pneumonia) 05/11/2016  . Sepsis (HCC) 05/11/2016  . Infection of urinary tract 05/11/2016  . Acute renal failure (HCC) 05/11/2016  . GERD (gastroesophageal reflux disease) 05/11/2016  . BPH (benign prostatic hyperplasia) 05/11/2016  . Abnormal ECG   . Acute kidney injury [facility])     Past Surgical History:  Procedure Laterality Date  . CATARACT  EXTRACTION      Prior to Admission medications   Medication Sig Start Date End Date Taking? Authorizing Provider  albuterol (VENTOLIN HFA) 108 (90 Base) MCG/ACT inhaler Inhale 1 puff into the lungs every 6 (six) hours as needed for wheezing or shortness of breath. 05/13/16   Richarda Overlie, MD  bismuth subsalicylate (PEPTO BISMOL) 262 MG/15ML suspension Take 30 mLs by mouth as needed for indigestion.    [provider]  cephALEXin (KEFLEX) 250 MG capsule Take 1 capsule (250 mg total) by mouth 4 (four) times daily. 06/16/18   Terrilee Files, MD  diltiazem (CARDIZEM CD) 120 MG 24 hr capsule Take 1 capsule (120 mg total) by mouth daily. Patient not taking: Reported on 06/15/2018 05/13/16   Richarda Overlie, MD  finasteride (PROSCAR) 5 MG tablet Take 5 mg by mouth daily. 07/08/20   [provider]  furosemide (LASIX) 20 MG tablet Take 1 tablet (20 mg total) by mouth daily. Patient not taking: Reported on 06/15/2018 05/13/16   Richarda Overlie, MD  guaiFENesin (MUCINEX) 600 MG 12 hr tablet Take 1 tablet (600 mg total) by mouth 2 (two) times daily. Patient not taking: Reported on 06/15/2018 05/13/16   Richarda Overlie, MD  ibuprofen (ADVIL,MOTRIN) 200 MG tablet Take 200 mg by mouth as needed for moderate pain.    [provider]  pantoprazole (PROTONIX) 40 MG tablet Take 1 tablet (40 mg total) by mouth daily. Patient not taking: Reported on 06/15/2018 05/13/16   Abrol,  Germain OsgoodNayana, MD  tamsulosin (FLOMAX) 0.4 MG CAPS capsule Take 1 capsule (0.4 mg total) by mouth daily. 06/09/18   Mesner, Barbara CowerJason, MD  thiamine (VITAMIN B-1) 100 MG tablet Take 100 mg by mouth daily.    [provider]  vitamin B-12 (CYANOCOBALAMIN) 1000 MCG tablet Take 1,000 mcg by mouth daily.    [provider]    Allergies Tamsulosin  Family History  Problem Relation Age of Onset  . Prostate cancer Unknown     Social History Social History   Tobacco Use  . Smoking status: Former Games developermoker  . Smokeless  tobacco: Former Engineer, waterUser  Substance Use Topics  . Alcohol use: No  . Drug use: No    Review of Systems  Review of Systems  Unable to perform ROS: Acuity of condition     ____________________________________________  PHYSICAL EXAM:      VITAL SIGNS: ED Triage Vitals  Enc Vitals Group     BP      Pulse      Resp      Temp      Temp src      SpO2      Weight      Height      Head Circumference      Peak Flow      Pain Score      Pain Loc      Pain Edu?      Excl. in GC?      Physical Exam Vitals and nursing note reviewed.  Constitutional:      General: He is not in acute distress.    Appearance: He is well-developed. He is ill-appearing.  HENT:     Head: Normocephalic and atraumatic.     Mouth/Throat:     Comments: Dry MM Eyes:     Conjunctiva/sclera: Conjunctivae normal.  Cardiovascular:     Rate and Rhythm: Normal rate and regular rhythm.     Heart sounds: Normal heart sounds. No murmur heard.  No friction rub.  Pulmonary:     Effort: Tachypnea and accessory muscle usage present. No respiratory distress.     Breath sounds: Examination of the right-middle field reveals rales. Examination of the left-middle field reveals rales. Examination of the right-lower field reveals rales. Examination of the left-lower field reveals rales. Decreased breath sounds and rales present. No wheezing.  Abdominal:     General: Bowel sounds are decreased. There is distension.     Palpations: Abdomen is soft.     Tenderness: There is no abdominal tenderness.  Musculoskeletal:     Cervical back: Neck supple.  Skin:    General: Skin is warm.     Capillary Refill: Capillary refill takes less than 2 seconds.  Neurological:     Mental Status: He is alert and oriented to person, place, and time.     Motor: No abnormal muscle tone.       ____________________________________________   LABS (all labs ordered are listed, but only abnormal results are displayed)  Labs Reviewed    RESPIRATORY PANEL BY RT PCR (FLU A&B, COVID) - Abnormal; Notable for the following components:      Result Value   SARS Coronavirus 2 by RT PCR POSITIVE (*)    All other components within normal limits  CBC WITH DIFFERENTIAL/PLATELET - Abnormal; Notable for the following components:   Platelets 144 (*)    Lymphs Abs 0.5 (*)    All other components within normal limits  COMPREHENSIVE METABOLIC PANEL -  Abnormal; Notable for the following components:   CO2 19 (*)    Glucose, Bld 179 (*)    BUN 54 (*)    Creatinine, Ser 2.29 (*)    Total Protein 8.3 (*)    AST 175 (*)    ALT 61 (*)    Total Bilirubin 2.6 (*)    GFR, Estimated 28 (*)    Anion gap 19 (*)    All other components within normal limits  BLOOD GAS, VENOUS - Abnormal; Notable for the following components:   pCO2, Ven 37 (*)    pO2, Ven <31.0 (*)    Acid-base deficit 2.1 (*)    All other components within normal limits  LACTIC ACID, PLASMA - Abnormal; Notable for the following components:   Lactic Acid, Venous 6.1 (*)    All other components within normal limits  FIBRIN DERIVATIVES D-DIMER ([facility] ONLY) - Abnormal; Notable for the following components:   Fibrin derivatives D-dimer ([facility]) 4,271.79 (*)    All other components within normal limits  TROPONIN I (HIGH SENSITIVITY) - Abnormal; Notable for the following components:   Troponin I (High Sensitivity) 36 (*)    All other components within normal limits  CULTURE, BLOOD (ROUTINE X 2)  CULTURE, BLOOD (ROUTINE X 2)  EXPECTORATED SPUTUM ASSESSMENT W REFEX TO RESP CULTURE  BRAIN NATRIURETIC PEPTIDE  AMMONIA  PROTIME-INR  PROCALCITONIN  LACTIC ACID, PLASMA  TROPONIN I (HIGH SENSITIVITY)    ____________________________________________  EKG: Sinus rhythm, VR 87. QRS 79, QTc 427. LVH. RSR'. No acute ST elevation or depressions. Mild baseline wander/artifact. ________________________________________  RADIOLOGY All imaging, including plain films, CT scans, and  ultrasounds, independently reviewed by me, and interpretations confirmed via formal radiology reads.  ED MD interpretation:   CXR: Hazy multifocal PNA  Official radiology report(s): DG Chest Portable 1 View  Result Date: 09/21/2020 CLINICAL DATA:  Shortness of breath EXAM: PORTABLE CHEST 1 VIEW COMPARISON:  June 16, 2018 FINDINGS: The heart size and mediastinal contours are within normal limits. Again noted is scarring and architectural distortion at the right lung apex. There is however a hazy patchy airspace opacity seen at both lower lungs. No pleural effusion is seen. No acute osseous abnormality. IMPRESSION: Hazy patchy airspace opacity at both lung bases, which could be due to infectious etiology. Right apical architectural distortion and scarring Electronically Signed   By: Jonna Clark M.D.   On: 09/23/2020 22:30    ____________________________________________  PROCEDURES   Procedure(s) performed (including Critical Care):  .Critical Care Performed by: Shaune Pollack, MD Authorized by: Shaune Pollack, MD   Critical care provider statement:    Critical care time (minutes):  35   Critical care time was exclusive of:  Separately billable procedures and treating other patients and teaching time   Critical care was necessary to treat or prevent imminent or life-threatening deterioration of the following conditions:  Cardiac failure, circulatory failure and respiratory failure   Critical care was time spent personally by me on the following activities:  Development of treatment plan with patient or surrogate, discussions with consultants, evaluation of patient's response to treatment, examination of patient, obtaining history from patient or surrogate, ordering and performing treatments and interventions, ordering and review of laboratory studies, ordering and review of radiographic studies, pulse oximetry, re-evaluation of patient's condition and review of old charts   I assumed  direction of critical care for this patient from another provider in my specialty: no      ____________________________________________  INITIAL IMPRESSION /  MDM / ASSESSMENT AND PLAN / ED COURSE  As part of my medical decision making, I reviewed the following data within the electronic MEDICAL RECORD NUMBER Nursing notes reviewed and incorporated, Old chart reviewed, Notes from prior ED visits, and [place] Controlled Substance Database       *RAJEEV ESCUE was evaluated in Emergency Department on 10/08/2020 for the symptoms described in the history of present illness. He was evaluated in the context of the global COVID-19 pandemic, which necessitated consideration that the patient might be at risk for infection with the SARS-CoV-2 virus that causes COVID-19. Institutional protocols and algorithms that pertain to the evaluation of patients at risk for COVID-19 are in a state of rapid change based on information released by regulatory bodies including the CDC and federal and state organizations. These policies and algorithms were followed during the patient's care in the ED.  Some ED evaluations and interventions may be delayed as a result of limited staffing during the pandemic.*     Medical Decision Making:  79 yo M here with acute hypoxic resp failure, confusion. On arrival, pt satting 60s on RA w/ improvement to only low 80s on NRB. CXR reviewed by me is c/f multifocal PNA. Clinically, highest concern is for acute hypoxic resp failure 2/2 COVID-19 vs viral PNA vs multifocal PNA. Pt started on broad-spectrum ABX, fluids. BCx sent.   Pt placed on Bipap with marked improvement in O2 sats. COVID positive. Labs otherwise show elevated Procal and pt has been given broad-spectrum ABX. Will add on steroids, remdesevir, and admit. D-Dimer elevated so CT Angio ordered. Family updated and in agreement. On telemetry, pt with sinus tach/SR without ectopy or arrhythmia. LA 6 which I suspect is 2/2 WOB and  sepsis/SIRS. 2L fluid ordered, will be cautious with additional fluids due to tenuous resp status and h/o CHF.  ____________________________________________  FINAL CLINICAL IMPRESSION(S) / ED DIAGNOSES  Final diagnoses:  Pneumonia due to COVID-19 virus  Acute respiratory failure with hypoxia (HCC)     MEDICATIONS GIVEN DURING THIS VISIT:  Medications  heparin injection 5,000 Units (has no administration in time range)  methylPREDNISolone sodium succinate (SOLU-MEDROL) 125 mg/2 mL injection 43.125 mg (has no administration in time range)    Followed by  predniSONE (DELTASONE) tablet 50 mg (has no administration in time range)  pantoprazole (PROTONIX) injection 40 mg (has no administration in time range)  azithromycin (ZITHROMAX) 500 mg in sodium chloride 0.9 % 250 mL IVPB (has no administration in time range)  cefTRIAXone (ROCEPHIN) 2 g in sodium chloride 0.9 % 100 mL IVPB (has no administration in time range)  sodium chloride 0.9 % bolus 1,000 mL (0 mLs Intravenous Stopped 10/05/2020 2339)  cefTRIAXone (ROCEPHIN) 2 g in sodium chloride 0.9 % 100 mL IVPB (0 g Intravenous Stopped 10/10/2020 2312)  azithromycin (ZITHROMAX) 500 mg in sodium chloride 0.9 % 250 mL IVPB (0 mg Intravenous Stopped 09/16/2020 2344)  sodium chloride 0.9 % bolus 1,000 mL (0 mLs Intravenous Stopped 10/08/20 0042)  methylPREDNISolone sodium succinate (SOLU-MEDROL) 125 mg/2 mL injection 87 mg (87 mg Intravenous Given 10/04/2020 2353)     ED Discharge Orders    None       Note:  This document was prepared using Dragon voice recognition software and may include unintentional dictation errors.   Shaune Pollack, MD 10/08/20 [id number]

## 2020-10-07 NOTE — H&P (Addendum)
NAME:  [name], MRN:  [id number], DOB:  05-15-1941, LOS: 0 ADMISSION DATE:  09/16/2020, CONSULTATION DATE:  10/25 REFERRING MD:  Dr Erma Heritage EDP, CHIEF COMPLAINT:  Hypoxemic respiratory failure   Brief History   79 year old male admitted with hypoxemic respiratory failure secondary to COVID 19 requiring BiPAP.   History of present illness   79 year old with past medical history as below, which is significant for GERD and hiatal hernia.  History taking limited by BiPAP and dyspnea.  I contacted family via phone who also did not know much about his medical history besides that he has had kidney issues.  They do not know what medications he takes at home.  He was in his usual state of health until approximately 10/12 when he described fever to his son.  He also complained of dyspnea and cough.  He has no known sick contacts.  His symptoms progressed over the preceding 2 or so weeks.  He called family on 10/25 with these complaints.  When they got to him and checked his pulse ox at home he was in the 60s.  Upon arrival to the emergency department he continued to sat in the 60s with improvement only to the low 80s on a nonrebreather.  He was urgently started on BiPAP.  Chest x-ray concerning for multifocal opacification.  He was started on empiric CAP antibiotics and steroids given concern for COVID-19 infection.  Covid PCR did result test positive.  PCCM was asked to admit to ICU.  Past Medical History   has a past medical history of Chickenpox, GERD (gastroesophageal reflux disease), Hiatal hernia, and Prostate enlargement.  Significant Hospital Events   10/25 admit on BiPAP  Consults:    Procedures:    Significant Diagnostic Tests:    Micro Data:  COVID-19 10/25 > positive  Antimicrobials:  Ceftriaxone 10/25 > Azithromycin 10/25 >  Interim history/subjective:    Objective   Blood pressure 140/87, pulse 84, resp. rate (!) 28, height 6\' 2"  (1.88 m), weight 86.2 kg, SpO2 96  %.        Intake/Output Summary (Last 24 hours) at 10/13/2020 2318 Last data filed at 10/03/2020 2312 Gross per 24 hour  Intake 100 ml  Output --  Net 100 ml   Filed Weights   09/18/2020 2228  Weight: 86.2 kg    Examination: General: Elderly male in mild distress on BiPAP HENT: Normocephalic, atraumatic, PERRL, no JVD Lungs: Coarse bilaterally, tachypneic to the mid 20s Cardiovascular: Regular rate and rhythm Abdomen: Protuberant, protruding umbilical hernia Extremities: No acute deformity, range of motion rotation, or edema Neuro: Alert, oriented, nonfocal   Resolved Hospital Problem list     Assessment & Plan:   Acute hypoxemic respiratory failure COVID-19 pneumonia Cannot rule out superimposed bacterial pneumonia -Continue BiPAP overnight and attempt to transition to heated high flow nasal cannula in the morning -Patient would not want intubation should things worsen I have confirmed this with his daughter-in-law -Solu-Medrol 1 meg per kick per day -Encourage prone positioning if he can tolerate -Empiric ceftriaxone, azithromycin - D/C remdesivir considering renal, hepatic dysfunction and limited evidence of benefit.   Elevated D-dimer -We will treat with subcutaneous heparin per Covid protocol -Could consider CT angiogram if he stabilizes and renal function improves -Trend D-dimer - Echocardiogram  Acute kidney injury Chronic kidney disease -Gentle IV fluid resuscitation -Trend BMP  Elevated transaminases - Trend  Lactic acidosis: presumably r/t work of breathing - Trend  Best practice:  Diet: NPO  Pain/Anxiety/Delirium protocol (if indicated): NA VAP protocol (if indicated): NA DVT prophylaxis: SQH GI prophylaxis: PPI Glucose control: SSI Mobility: BR Code Status: DNR Family Communication: I have addressed code status with patient and he instructed me to call his Daughter in Social worker. He tells me he would not want to go on a ventilator, which seems a  reasonable choice given his age and COVID-19 severity. I have relayed this to his daughter in law who understands and agrees. DNR/DNI order placed.  Disposition: ICU  Labs   CBC: Recent Labs  Lab 09/17/2020 2220  WBC 4.8  NEUTROABS 3.9  HGB 15.2  HCT 46.6  MCV 88.4  PLT 144*    Basic Metabolic Panel: No results for input(s): NA, K, CL, CO2, GLUCOSE, BUN, CREATININE, CALCIUM, MG, PHOS in the last 168 hours. GFR: CrCl cannot be calculated (Patient's most recent lab result is older than the maximum 21 days allowed.). Recent Labs  Lab 09/22/2020 2220 10/06/2020 2222  WBC 4.8  --   LATICACIDVEN  --  6.1*    Liver Function Tests: No results for input(s): AST, ALT, ALKPHOS, BILITOT, PROT, ALBUMIN in the last 168 hours. No results for input(s): LIPASE, AMYLASE in the last 168 hours. Recent Labs  Lab 09/26/2020 2220  AMMONIA 19    ABG    Component Value Date/Time   HCO3 22.4 10/12/2020 2216   TCO2 25 06/13/2011 1448   ACIDBASEDEF 2.1 (H) 09/21/2020 2216   O2SAT 11.0 09/16/2020 2216     Coagulation Profile: Recent Labs  Lab 10/04/2020 2220  INR 1.0    Cardiac Enzymes: No results for input(s): CKTOTAL, CKMB, CKMBINDEX, TROPONINI in the last 168 hours.  HbA1C: Hgb A1c MFr Bld  Date/Time Value Ref Range Status  05/11/2016 04:20 AM 6.2 (H) 4.8 - 5.6 % Final    Comment:    (NOTE)         Pre-diabetes: 5.7 - 6.4         Diabetes: >6.4         Glycemic control for adults with diabetes: <7.0     CBG: No results for input(s): GLUCAP in the last 168 hours.  Review of Systems:   Bolds are positive  Constitutional: weight loss, gain, night sweats, Fevers, chills, fatigue .  HEENT: headaches, Sore throat, sneezing, nasal congestion, post nasal drip, Difficulty swallowing, Tooth/dental problems, visual complaints visual changes, ear ache CV:  chest pain, radiates:,Orthopnea, PND, swelling in lower extremities, dizziness, palpitations, syncope.  GI  heartburn, indigestion,  abdominal pain, nausea, vomiting, diarrhea, change in bowel habits, loss of appetite, bloody stools.  Resp: cough, productive: "creamy white", hemoptysis, dyspnea, chest pain, pleuritic.  Skin: rash or itching or icterus GU: dysuria, change in color of urine, urgency or frequency. flank pain, hematuria  MS: joint pain or swelling. decreased range of motion  Psych: change in mood or affect. depression or anxiety.  Neuro: difficulty with speech, weakness, numbness, ataxia    Past Medical History  He,  has a past medical history of Chickenpox, GERD (gastroesophageal reflux disease), Hiatal hernia, and Prostate enlargement.   Surgical History    Past Surgical History:  Procedure Laterality Date  . CATARACT EXTRACTION       Social History   reports that he has quit smoking. He has quit using smokeless tobacco. He reports that he does not drink alcohol and does not use drugs.   Family History   His family history includes Prostate cancer in his unknown relative.   Allergies  Allergies  Allergen Reactions  . Tamsulosin Other (See Comments)    bleeding     Home Medications  Prior to Admission medications   Medication Sig Start Date End Date Taking? Authorizing Provider  albuterol (VENTOLIN HFA) 108 (90 Base) MCG/ACT inhaler Inhale 1 puff into the lungs every 6 (six) hours as needed for wheezing or shortness of breath. 05/13/16   Richarda Overlie, MD  bismuth subsalicylate (PEPTO BISMOL) 262 MG/15ML suspension Take 30 mLs by mouth as needed for indigestion.    [provider]  cephALEXin (KEFLEX) 250 MG capsule Take 1 capsule (250 mg total) by mouth 4 (four) times daily. 06/16/18   Terrilee Files, MD  diltiazem (CARDIZEM CD) 120 MG 24 hr capsule Take 1 capsule (120 mg total) by mouth daily. Patient not taking: Reported on 06/15/2018 05/13/16   Richarda Overlie, MD  finasteride (PROSCAR) 5 MG tablet Take 5 mg by mouth daily. 07/08/20   [provider]  furosemide (LASIX) 20 MG  tablet Take 1 tablet (20 mg total) by mouth daily. Patient not taking: Reported on 06/15/2018 05/13/16   Richarda Overlie, MD  guaiFENesin (MUCINEX) 600 MG 12 hr tablet Take 1 tablet (600 mg total) by mouth 2 (two) times daily. Patient not taking: Reported on 06/15/2018 05/13/16   Richarda Overlie, MD  ibuprofen (ADVIL,MOTRIN) 200 MG tablet Take 200 mg by mouth as needed for moderate pain.    [provider]  pantoprazole (PROTONIX) 40 MG tablet Take 1 tablet (40 mg total) by mouth daily. Patient not taking: Reported on 06/15/2018 05/13/16   Richarda Overlie, MD  tamsulosin (FLOMAX) 0.4 MG CAPS capsule Take 1 capsule (0.4 mg total) by mouth daily. 06/09/18   Mesner, Barbara Cower, MD  thiamine (VITAMIN B-1) 100 MG tablet Take 100 mg by mouth daily.    [provider]  vitamin B-12 (CYANOCOBALAMIN) 1000 MCG tablet Take 1,000 mcg by mouth daily.    [provider]     Critical care time: 42 minutes      Joneen Roach, AGACNP-BC [facility]  See Amion for personal pager PCCM on call pager [phone]  10/02/2020 11:20 PM

## 2020-10-07 NOTE — ED Triage Notes (Addendum)
Pt to STAT desk via w/c, grunting resp, mottled color; family reports pt with fever & [facility] for few days; family st they checked pt's pulse ox at home and it was in the 60's

## 2020-10-08 ENCOUNTER — Inpatient Hospital Stay: Payer: Medicare Other

## 2020-10-08 ENCOUNTER — Inpatient Hospital Stay
Admit: 2020-10-08 | Discharge: 2020-10-08 | Disposition: A | Discharge status: Home / Self Care | Payer: Medicare Other | Attending: Pulmonary Disease | Admitting: Pulmonary Disease

## 2020-10-08 ENCOUNTER — Telehealth: Payer: Self-pay | Admitting: Nurse Practitioner

## 2020-10-08 DIAGNOSIS — K449 Diaphragmatic hernia without obstruction or gangrene: Secondary | ICD-10-CM | POA: Diagnosis present

## 2020-10-08 DIAGNOSIS — Z888 Allergy status to other drugs, medicaments and biological substances status: Secondary | ICD-10-CM | POA: Diagnosis not present

## 2020-10-08 DIAGNOSIS — Z515 Encounter for palliative care: Secondary | ICD-10-CM | POA: Diagnosis not present

## 2020-10-08 DIAGNOSIS — R630 Anorexia: Secondary | ICD-10-CM | POA: Diagnosis present

## 2020-10-08 DIAGNOSIS — Z7189 Other specified counseling: Secondary | ICD-10-CM | POA: Diagnosis not present

## 2020-10-08 DIAGNOSIS — R7989 Other specified abnormal findings of blood chemistry: Secondary | ICD-10-CM | POA: Insufficient documentation

## 2020-10-08 DIAGNOSIS — Z8619 Personal history of other infectious and parasitic diseases: Secondary | ICD-10-CM | POA: Diagnosis not present

## 2020-10-08 DIAGNOSIS — I724 Aneurysm of artery of lower extremity: Secondary | ICD-10-CM | POA: Diagnosis present

## 2020-10-08 DIAGNOSIS — E872 Acidosis, unspecified: Secondary | ICD-10-CM | POA: Insufficient documentation

## 2020-10-08 DIAGNOSIS — J1282 Pneumonia due to coronavirus disease 2019: Secondary | ICD-10-CM | POA: Diagnosis present

## 2020-10-08 DIAGNOSIS — Z9849 Cataract extraction status, unspecified eye: Secondary | ICD-10-CM | POA: Diagnosis not present

## 2020-10-08 DIAGNOSIS — J159 Unspecified bacterial pneumonia: Secondary | ICD-10-CM | POA: Diagnosis present

## 2020-10-08 DIAGNOSIS — U071 COVID-19: Principal | ICD-10-CM | POA: Diagnosis present

## 2020-10-08 DIAGNOSIS — Z6826 Body mass index (BMI) 26.0-26.9, adult: Secondary | ICD-10-CM | POA: Diagnosis not present

## 2020-10-08 DIAGNOSIS — N183 Chronic kidney disease, stage 3 unspecified: Secondary | ICD-10-CM | POA: Diagnosis present

## 2020-10-08 DIAGNOSIS — N400 Enlarged prostate without lower urinary tract symptoms: Secondary | ICD-10-CM | POA: Diagnosis present

## 2020-10-08 DIAGNOSIS — R7401 Elevation of levels of liver transaminase levels: Secondary | ICD-10-CM | POA: Diagnosis present

## 2020-10-08 DIAGNOSIS — J9601 Acute respiratory failure with hypoxia: Secondary | ICD-10-CM | POA: Diagnosis present

## 2020-10-08 DIAGNOSIS — K219 Gastro-esophageal reflux disease without esophagitis: Secondary | ICD-10-CM | POA: Diagnosis present

## 2020-10-08 DIAGNOSIS — N179 Acute kidney failure, unspecified: Secondary | ICD-10-CM | POA: Diagnosis not present

## 2020-10-08 DIAGNOSIS — Z87891 Personal history of nicotine dependence: Secondary | ICD-10-CM | POA: Diagnosis not present

## 2020-10-08 DIAGNOSIS — G9341 Metabolic encephalopathy: Secondary | ICD-10-CM | POA: Diagnosis present

## 2020-10-08 DIAGNOSIS — R0902 Hypoxemia: Secondary | ICD-10-CM | POA: Diagnosis present

## 2020-10-08 DIAGNOSIS — Z66 Do not resuscitate: Secondary | ICD-10-CM | POA: Diagnosis present

## 2020-10-08 DIAGNOSIS — Z79899 Other long term (current) drug therapy: Secondary | ICD-10-CM | POA: Diagnosis not present

## 2020-10-08 LAB — ECHOCARDIOGRAM LIMITED
Height: 74 in
S' Lateral: 2.95 cm
Weight: 3040 oz

## 2020-10-08 LAB — MRSA PCR SCREENING: MRSA by PCR: NEGATIVE

## 2020-10-08 LAB — LACTIC ACID, PLASMA: Lactic Acid, Venous: 2.5 mmol/L (ref 0.5–1.9)

## 2020-10-08 LAB — TROPONIN I (HIGH SENSITIVITY): Troponin I (High Sensitivity): 31 ng/L — ABNORMAL HIGH (ref <18)

## 2020-10-08 MED ORDER — ENOXAPARIN SODIUM 100 MG/ML ~~LOC~~ SOLN
1.0000 mg/kg | Freq: Two times a day (BID) | SUBCUTANEOUS | Status: DC
  Filled 2020-10-08 (×2): qty 1

## 2020-10-08 MED ORDER — ORAL CARE MOUTH RINSE
15.0000 mL | Freq: Two times a day (BID) | OROMUCOSAL | Status: DC
  Administered 2020-10-08 – 2020-10-10 (×6): 15 mL via OROMUCOSAL

## 2020-10-08 MED ORDER — SODIUM CHLORIDE 0.9 % IV SOLN
500.0000 mg | INTRAVENOUS | Status: DC
  Administered 2020-10-08 – 2020-10-10 (×3): 500 mg via INTRAVENOUS
  Filled 2020-10-08 (×3): qty 500

## 2020-10-08 MED ORDER — CHLORHEXIDINE GLUCONATE CLOTH 2 % EX PADS
6.0000 | MEDICATED_PAD | Freq: Every day | CUTANEOUS | Status: DC
  Administered 2020-10-08 – 2020-10-10 (×3): 6 via TOPICAL

## 2020-10-08 MED ORDER — SODIUM CHLORIDE 0.9 % IV SOLN: 100.0000 mg | Freq: Every day | INTRAVENOUS | Status: DC

## 2020-10-08 MED ORDER — HEPARIN SODIUM (PORCINE) 5000 UNIT/ML IJ SOLN
5000.0000 [IU] | Freq: Three times a day (TID) | INTRAMUSCULAR | Status: DC
  Administered 2020-10-08 (×2): 5000 [IU] via SUBCUTANEOUS
  Filled 2020-10-08: qty 1

## 2020-10-08 MED ORDER — METHYLPREDNISOLONE SODIUM SUCC 125 MG IJ SOLR
0.5000 mg/kg | Freq: Two times a day (BID) | INTRAMUSCULAR | Status: AC
  Administered 2020-10-08 – 2020-10-10 (×6): 43.125 mg via INTRAVENOUS
  Filled 2020-10-08 (×6): qty 2

## 2020-10-08 MED ORDER — PREDNISONE 20 MG PO TABS: 50.0000 mg | ORAL_TABLET | Freq: Every day | ORAL | Status: DC

## 2020-10-08 MED ORDER — SODIUM CHLORIDE 0.9 % IV SOLN
2.0000 g | INTRAVENOUS | Status: DC
  Administered 2020-10-08 – 2020-10-10 (×3): 2 g via INTRAVENOUS
  Filled 2020-10-08 (×2): qty 2
  Filled 2020-10-08: qty 20

## 2020-10-08 MED ORDER — SODIUM CHLORIDE 0.9 % IV SOLN: 2.0000 g | INTRAVENOUS | Status: DC

## 2020-10-08 MED ORDER — PANTOPRAZOLE SODIUM 40 MG IV SOLR
40.0000 mg | Freq: Every day | INTRAVENOUS | Status: DC
  Administered 2020-10-08 – 2020-10-10 (×4): 40 mg via INTRAVENOUS
  Filled 2020-10-08 (×4): qty 40

## 2020-10-08 MED ORDER — CHLORHEXIDINE GLUCONATE 0.12 % MT SOLN
15.0000 mL | Freq: Two times a day (BID) | OROMUCOSAL | Status: DC
  Administered 2020-10-08 – 2020-10-10 (×6): 15 mL via OROMUCOSAL
  Filled 2020-10-08 (×2): qty 15

## 2020-10-08 MED ORDER — SODIUM CHLORIDE 0.9 % IV SOLN
200.0000 mg | Freq: Once | INTRAVENOUS | Status: DC
  Filled 2020-10-08: qty 40

## 2020-10-08 MED ORDER — ENOXAPARIN SODIUM 100 MG/ML ~~LOC~~ SOLN
1.0000 mg/kg | SUBCUTANEOUS | Status: DC
  Administered 2020-10-08: 85 mg via SUBCUTANEOUS
  Filled 2020-10-08 (×2): qty 1

## 2020-10-08 NOTE — Progress Notes (Signed)
Pt's family unsure of pt's home medications. Will need to follow up with [facility] in the morning to get medication list at this time.

## 2020-10-08 NOTE — Telephone Encounter (Signed)
Chart review to contact patient for [facility] for the treatment of COVID-19.  Chart reveals patient is currently hospitalized and therefore will be unable to receive outpatient [facility].   Due to chronic medical conditions, [facility] or appropriate alternative treatment recommended while hospitalized.   Patient will not be contacted by [facility] for outpatient services unless he is discharged in the near future and has not received equivalent treatment in the hospital.

## 2020-10-08 NOTE — Progress Notes (Signed)
*  PRELIMINARY RESULTS* Echocardiogram 2D Echocardiogram has been performed.  Cristela Blue 10/08/2020, 10:48 AM

## 2020-10-08 NOTE — Progress Notes (Signed)
Patient requested to be taken off of bipap, patient placed on HHFNC and NRB with sats in the upper 80%. MD and RN aware and new sat gaol of 84% or higher ordered.

## 2020-10-08 NOTE — Progress Notes (Signed)
NAME:  [name], MRN:  [id number], DOB:  1941-08-29, LOS: 0 ADMISSION DATE:  10/23/2020, CONSULTATION DATE:  10/25 REFERRING MD:  Dr Erma Heritage EDP, CHIEF COMPLAINT:  Hypoxemic respiratory failure   Brief History   79 year old male admitted with hypoxemic respiratory failure secondary to COVID 19 requiring BiPAP.    Past Medical History   has a past medical history of Chickenpox, GERD (gastroesophageal reflux disease), Hiatal hernia, and Prostate enlargement.  Significant Hospital Events   10/25 admit on BiPAP  Consults:    Procedures:  IMPRESSION: 1.  No evidence of deep venous thrombosis in either lower extremity.  2. 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 Vascular surgery consultation advised given this finding.  Significant Diagnostic Tests:    Micro Data:  COVID-19 10/25 > positive  Antimicrobials:  Ceftriaxone 10/25 > Azithromycin 10/25 >  Interim history/subjective:    Objective   Blood pressure 125/74, pulse 62, temperature (!) 96.7 F (35.9 C), temperature source Axillary, resp. rate (!) 22, height 6\' 2"  (1.88 m), weight 86.2 kg, SpO2 (!) 88 %.    FiO2 (%):  [100 %] 100 %   Intake/Output Summary (Last 24 hours) at 10/08/2020 1734 Last data filed at 10/08/2020 1430 Gross per 24 hour  Intake 2350 ml  Output 300 ml  Net 2050 ml   Filed Weights   10-23-2020 2228  Weight: 86.2 kg    Examination: General: Elderly male in mild distress on BiPAP HENT: Normocephalic, atraumatic, PERRL, no JVD Lungs: Coarse bilaterally, tachypneic to the mid 20s Cardiovascular: Regular rate and rhythm Abdomen: Protuberant, protruding umbilical hernia Extremities: No acute deformity, range of motion rotation, or edema Neuro: Alert, oriented, nonfocal   Resolved Hospital Problem list     Assessment & Plan:   Acute hypoxemic respiratory failure COVID-19 pneumonia Cannot rule out superimposed  bacterial pneumonia -NIV/BiPAP, or  HHFNC as tolerated  -He dismisses intubation, and wishes to drink  -Encourage prone positioning if he can tolerate -Empiric ceftriaxone, azithromycin - D/C remdesivir considering renal, hepatic dysfunction and limited evidence of benefit.   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 He tells me he would not want to go on a ventilator, which seems a reasonable choice given his age and COVID-19 severity.  DNR/DNI order placed  Disposition: ICU  Labs   CBC: Recent Labs  Lab 10/23/20 2220  WBC 4.8  NEUTROABS 3.9  HGB 15.2  HCT 46.6  MCV 88.4  PLT 144*    Basic Metabolic Panel: Recent Labs  Lab 2020-10-23 2220  NA 143  K 4.2  CL 105  CO2 19*  GLUCOSE 179*  BUN 54*  CREATININE 2.29*  CALCIUM 9.3   GFR: Estimated Creatinine Clearance: 29.4 mL/min (A) (by C-G formula based on SCr of 2.29 mg/dL (H)). Recent Labs  Lab 10-23-20 2220 October 23, 2020 2222 2020/10/23 2234 10/08/20  0033  PROCALCITON  --   --  0.95  --   WBC 4.8  --   --   --   LATICACIDVEN  --  6.1*  --  2.5*    Liver Function Tests: Recent Labs  Lab 10/18/2020 2220  AST 175*  ALT 61*  ALKPHOS 83  BILITOT 2.6*  PROT 8.3*  ALBUMIN 4.2   No results for input(s): LIPASE, AMYLASE  in the last 168 hours. Recent Labs  Lab 10/18/2020 2220  AMMONIA 19    ABG    Component Value Date/Time   HCO3 22.4 October 18, 2020 2216   TCO2 25 06/13/2011 1448   ACIDBASEDEF 2.1 (H) 2020-10-18 2216   O2SAT 11.0 2020-10-18 2216     Coagulation Profile: Recent Labs  Lab 10-18-20 2220  INR 1.0    Cardiac Enzymes: No results for input(s): CKTOTAL, CKMB, CKMBINDEX, TROPONINI in the last 168 hours.  HbA1C: Hgb A1c MFr Bld  Date/Time Value Ref Range Status  05/11/2016 04:20 AM 6.2 (H) 4.8 - 5.6 % Final    Comment:    (NOTE)         Pre-diabetes: 5.7 - 6.4         Diabetes: >6.4         Glycemic control for adults with diabetes: <7.0     CBG: No results for input(s): GLUCAP in the last 168 hours.  Review of Systems:   Bolds are positive  Constitutional: weight loss, gain, night sweats, Fevers, chills, fatigue .  HEENT: headaches, Sore throat, sneezing, nasal congestion, post nasal drip, Difficulty swallowing, Tooth/dental problems, visual complaints visual changes, ear ache CV:  chest pain, radiates:,Orthopnea, PND, swelling in lower extremities, dizziness, palpitations, syncope.  GI  heartburn, indigestion, abdominal pain, nausea, vomiting, diarrhea, change in bowel habits, loss of appetite, bloody stools.  Resp: cough, productive: "creamy white", hemoptysis, dyspnea, chest pain, pleuritic.  Skin: rash or itching or icterus GU: dysuria, change in color of urine, urgency or frequency. flank pain, hematuria  MS: joint pain or swelling. decreased range of motion  Psych: change in mood or affect. depression or anxiety.  Neuro: difficulty with speech, weakness, numbness, ataxia    Past Medical History  He,  has a past medical history of Chickenpox, GERD (gastroesophageal reflux disease), Hiatal hernia, and Prostate enlargement.   Surgical History    Past Surgical History:  Procedure Laterality Date  . CATARACT EXTRACTION       Social History   reports that he has  quit smoking. He has quit using smokeless tobacco. He reports that he does not drink alcohol and does not use drugs.   Family History   His family history includes Prostate cancer in his unknown relative.   Allergies Allergies  Allergen Reactions  . Tamsulosin Other (See Comments)    bleeding     Home Medications  Prior to Admission medications   Medication Sig Start Date End Date Taking? Authorizing Provider  albuterol (VENTOLIN HFA) 108 (90 Base) MCG/ACT inhaler Inhale 1 puff into the lungs every 6 (six) hours as needed for wheezing or shortness of breath. 05/13/16   Richarda Overlie, MD  bismuth subsalicylate (PEPTO BISMOL) 262 MG/15ML suspension Take 30 mLs by mouth as needed for indigestion.    [provider]  cephALEXin (KEFLEX) 250 MG capsule Take 1 capsule (250 mg total) by mouth 4 (four) times daily. 06/16/18   Terrilee Files, MD  diltiazem (CARDIZEM CD) 120 MG 24 hr capsule Take 1 capsule (120 mg  total) by mouth daily. Patient not taking: Reported on 06/15/2018 05/13/16   Richarda Overlie, MD  finasteride (PROSCAR) 5 MG tablet Take 5 mg by mouth daily. 07/08/20   [provider]  furosemide (LASIX) 20 MG tablet Take 1 tablet (20 mg total) by mouth daily. Patient not taking: Reported on 06/15/2018 05/13/16   Richarda Overlie, MD  guaiFENesin (MUCINEX) 600 MG 12 hr tablet Take 1 tablet (600 mg total) by mouth 2 (two) times daily. Patient not taking: Reported on 06/15/2018 05/13/16   Richarda Overlie, MD  ibuprofen (ADVIL,MOTRIN) 200 MG tablet Take 200 mg by mouth as needed for moderate pain.    [provider]  pantoprazole (PROTONIX) 40 MG tablet Take 1 tablet (40 mg total) by mouth daily. Patient not taking: Reported on 06/15/2018 05/13/16   Richarda Overlie, MD  tamsulosin (FLOMAX) 0.4 MG CAPS capsule Take 1 capsule (0.4 mg total) by mouth daily. 06/09/18   Mesner, Barbara Cower, MD  thiamine (VITAMIN B-1) 100 MG tablet Take 100 mg by mouth daily.    [provider]  vitamin  B-12 (CYANOCOBALAMIN) 1000 MCG tablet Take 1,000 mcg by mouth daily.    [provider]     Critical care time: 42 minutes     Patient seen and examined face to face fashion Images of the day and lab values reviewed Critical care time 41m  10/08/2020 5:34 PM

## 2020-10-08 NOTE — Progress Notes (Signed)
Remdesivir - Pharmacy Brief Note   O:  ALT: 61 CXR: Hazy patchy airspace opacity at both lung bases, which could be due to infectious etiology. SpO2: 88% on BiPAP   A/P:  Remdesivir 200 mg IVPB once followed by 100 mg IVPB daily x 4 days.   Laureen Ochs, PharmD 10/08/2020 12:02 AM

## 2020-10-09 DIAGNOSIS — Z515 Encounter for palliative care: Secondary | ICD-10-CM

## 2020-10-09 DIAGNOSIS — Z7189 Other specified counseling: Secondary | ICD-10-CM

## 2020-10-09 LAB — CBC WITH DIFFERENTIAL/PLATELET
Abs Immature Granulocytes: 0.04 10*3/uL (ref 0.00–0.07)
Basophils Absolute: 0 10*3/uL (ref 0.0–0.1)
Basophils Relative: 0 %
Eosinophils Absolute: 0 10*3/uL (ref 0.0–0.5)
Eosinophils Relative: 0 %
HCT: 42.4 % (ref 39.0–52.0)
Hemoglobin: 13.6 g/dL (ref 13.0–17.0)
Immature Granulocytes: 1 %
Lymphocytes Relative: 6 %
Lymphs Abs: 0.4 10*3/uL — ABNORMAL LOW (ref 0.7–4.0)
MCH: 28.9 pg (ref 26.0–34.0)
MCHC: 32.1 g/dL (ref 30.0–36.0)
MCV: 90.2 fL (ref 80.0–100.0)
Monocytes Absolute: 0.5 10*3/uL (ref 0.1–1.0)
Monocytes Relative: 7 %
Neutro Abs: 5.7 10*3/uL (ref 1.7–7.7)
Neutrophils Relative %: 86 %
Platelets: 145 10*3/uL — ABNORMAL LOW (ref 150–400)
RBC: 4.7 MIL/uL (ref 4.22–5.81)
RDW: 14.8 % (ref 11.5–15.5)
WBC: 6.6 10*3/uL (ref 4.0–10.5)
nRBC: 0 % (ref 0.0–0.2)

## 2020-10-09 LAB — COMPREHENSIVE METABOLIC PANEL
ALT: 43 U/L (ref 0–44)
AST: 91 U/L — ABNORMAL HIGH (ref 15–41)
Albumin: 3.3 g/dL — ABNORMAL LOW (ref 3.5–5.0)
Alkaline Phosphatase: 73 U/L (ref 38–126)
Anion gap: 11 (ref 5–15)
BUN: 41 mg/dL — ABNORMAL HIGH (ref 8–23)
CO2: 23 mmol/L (ref 22–32)
Calcium: 8.8 mg/dL — ABNORMAL LOW (ref 8.9–10.3)
Chloride: 114 mmol/L — ABNORMAL HIGH (ref 98–111)
Creatinine, Ser: 1.42 mg/dL — ABNORMAL HIGH (ref 0.61–1.24)
GFR, Estimated: 50 mL/min — ABNORMAL LOW (ref >60)
Glucose, Bld: 168 mg/dL — ABNORMAL HIGH (ref 70–99)
Potassium: 4.1 mmol/L (ref 3.5–5.1)
Sodium: 148 mmol/L — ABNORMAL HIGH (ref 135–145)
Total Bilirubin: 1.5 mg/dL — ABNORMAL HIGH (ref 0.3–1.2)
Total Protein: 6.7 g/dL (ref 6.5–8.1)

## 2020-10-09 LAB — MAGNESIUM: Magnesium: 2.7 mg/dL — ABNORMAL HIGH (ref 1.7–2.4)

## 2020-10-09 LAB — C-REACTIVE PROTEIN: CRP: 4 mg/dL — ABNORMAL HIGH (ref <1.0)

## 2020-10-09 LAB — FIBRIN DERIVATIVES D-DIMER (ARMC ONLY): Fibrin derivatives D-dimer (ARMC): 5796.24 ng/mL (FEU) — ABNORMAL HIGH (ref 0.00–499.00)

## 2020-10-09 LAB — GLUCOSE, CAPILLARY: Glucose-Capillary: 158 mg/dL — ABNORMAL HIGH (ref 70–99)

## 2020-10-09 LAB — HEPARIN ANTI-XA: Heparin LMW: 0.4 IU/mL

## 2020-10-09 LAB — PHOSPHORUS: Phosphorus: 2.9 mg/dL (ref 2.5–4.6)

## 2020-10-09 MED ORDER — ENOXAPARIN SODIUM 100 MG/ML ~~LOC~~ SOLN
1.0000 mg/kg | Freq: Two times a day (BID) | SUBCUTANEOUS | Status: DC
  Administered 2020-10-09 – 2020-10-11 (×4): 85 mg via SUBCUTANEOUS
  Filled 2020-10-09 (×5): qty 1

## 2020-10-09 NOTE — Consult Note (Signed)
Consultation Note Date: 10/09/2020   Patient Name: [name]  DOB: [birth year]  MRN: [id number]  Age / Sex: 79 y.o., male  PCP: Patient, No Pcp Per Referring Physician: Elyn Aquas, MD  Reason for Consultation: Establishing goals of care  HPI/Patient Profile: 79 year old with past medical history as below, which is significant for GERD and hiatal hernia.  History taking limited by BiPAP and dyspnea.  Clinical Assessment and Goals of Care: Patient is resting in bed. He is on high flow cannula and NRB. He lives alone.    He is fully functional at baseline, and does his own shopping, and cleans his home.   We discussed his diagnosis, prognosis, GOC, EOL wishes disposition and options.  A detailed discussion was had today regarding advanced directives.  Concepts specific to code status, artifical feeding and hydration, IV antibiotics and rehospitalization were discussed.  The difference between an aggressive medical intervention path and a comfort care path was discussed.  Values and goals of care important to patient and family were attempted to be elicited.  Discussed limitations of medical interventions to prolong quality of life in some situations and discussed the concept of human mortality.  He confirms DNR/DNI. He is hopeful for improvement.    SUMMARY OF RECOMMENDATIONS    DNR/DNI. Will continue to follow.        Primary Diagnoses: Present on Admission: . Pneumonia due to COVID-19 virus   I have reviewed the medical record, interviewed the patient and family, and examined the patient. The following aspects are pertinent.  Past Medical History:  Diagnosis Date  . Chickenpox   . GERD (gastroesophageal reflux disease)   . Hiatal hernia   . Prostate enlargement    Social History   Socioeconomic History  . Marital status: Widowed    Spouse name: Not on file  . Number of  children: Not on file  . Years of education: Not on file  . Highest education level: Not on file  Occupational History  . Not on file  Tobacco Use  . Smoking status: Former Games developer  . Smokeless tobacco: Former Engineer, water and Sexual Activity  . Alcohol use: No  . Drug use: No  . Sexual activity: Not Currently  Other Topics Concern  . Not on file  Social History Narrative  . Not on file   Social Determinants of Health   Financial Resource Strain:   . Difficulty of Paying Living Expenses: Not on file  Food Insecurity:   . Worried About Programme researcher, broadcasting/film/video in the Last Year: Not on file  . Ran Out of Food in the Last Year: Not on file  Transportation Needs:   . Lack of Transportation (Medical): Not on file  . Lack of Transportation (Non-Medical): Not on file  Physical Activity:   . Days of Exercise per Week: Not on file  . Minutes of Exercise per Session: Not on file  Stress:   . Feeling of Stress : Not on file  Social Connections:   .  Frequency of Communication with Friends and Family: Not on file  . Frequency of Social Gatherings with Friends and Family: Not on file  . Attends Religious Services: Not on file  . Active Member of Clubs or Organizations: Not on file  . Attends Banker Meetings: Not on file  . Marital Status: Not on file   Family History  Problem Relation Age of Onset  . Prostate cancer Unknown    Scheduled Meds: . chlorhexidine  15 mL Mouth Rinse BID  . Chlorhexidine Gluconate Cloth  6 each Topical Daily  . enoxaparin (LOVENOX) injection  1 mg/kg Subcutaneous Q24H  . mouth rinse  15 mL Mouth Rinse q12n4p  . methylPREDNISolone (SOLU-MEDROL) injection  0.5 mg/kg Intravenous Q12H   Followed by  . [START ON 2020-10-19] predniSONE  50 mg Oral Daily  . pantoprazole (PROTONIX) IV  40 mg Intravenous QHS   Continuous Infusions: . azithromycin (ZITHROMAX) 500 MG IVPB (Vial-Mate [employer]) Stopped (10/09/20 0048)  . cefTRIAXone (ROCEPHIN)  IV  Stopped (10/09/20 0154)   PRN Meds:. Medications Prior to Admission:  Prior to Admission medications   Medication Sig Start Date End Date Taking? Authorizing Provider  finasteride (PROSCAR) 5 MG tablet Take 5 mg by mouth daily. 07/08/20  Yes [provider]  albuterol (VENTOLIN HFA) 108 (90 Base) MCG/ACT inhaler Inhale 1 puff into the lungs every 6 (six) hours as needed for wheezing or shortness of breath. Patient not taking: Reported on 10/08/2020 05/13/16   Richarda Overlie, MD  bismuth subsalicylate (PEPTO BISMOL) 262 MG/15ML suspension Take 30 mLs by mouth as needed for indigestion. Patient not taking: Reported on 10/08/2020    [provider]  cephALEXin (KEFLEX) 250 MG capsule Take 1 capsule (250 mg total) by mouth 4 (four) times daily. Patient not taking: Reported on 10/08/2020 06/16/18   Terrilee Files, MD  diltiazem (CARDIZEM CD) 120 MG 24 hr capsule Take 1 capsule (120 mg total) by mouth daily. Patient not taking: Reported on 06/15/2018 05/13/16   Richarda Overlie, MD  furosemide (LASIX) 20 MG tablet Take 1 tablet (20 mg total) by mouth daily. Patient not taking: Reported on 06/15/2018 05/13/16   Richarda Overlie, MD  guaiFENesin (MUCINEX) 600 MG 12 hr tablet Take 1 tablet (600 mg total) by mouth 2 (two) times daily. Patient not taking: Reported on 06/15/2018 05/13/16   Richarda Overlie, MD  ibuprofen (ADVIL,MOTRIN) 200 MG tablet Take 200 mg by mouth as needed for moderate pain. Patient not taking: Reported on 10/08/2020    [provider]  pantoprazole (PROTONIX) 40 MG tablet Take 1 tablet (40 mg total) by mouth daily. Patient not taking: Reported on 06/15/2018 05/13/16   Richarda Overlie, MD  thiamine (VITAMIN B-1) 100 MG tablet Take 100 mg by mouth daily. Patient not taking: Reported on 10/08/2020    [provider]  vitamin B-12 (CYANOCOBALAMIN) 1000 MCG tablet Take 1,000 mcg by mouth daily. Patient not taking: Reported on 10/08/2020    [provider]    Allergies  Allergen Reactions  . Tamsulosin Other (See Comments)    bleeding   Review of Systems  Respiratory: Positive for shortness of breath.     Physical Exam Pulmonary:     Comments: NRB and HFNC.  Neurological:     Mental Status: He is alert.     Vital Signs: BP 114/75 (BP Location: Right Arm)   Pulse 68   Temp (!) 97.2 F (36.2 C) (Axillary)   Resp 19   Ht 6'  2" (1.88 m)   Wt 86.2 kg   SpO2 (!) 82%   BMI 24.39 kg/m  Pain Scale: 0-10   Pain Score: 0-No pain   SpO2: SpO2: (!) 82 % O2 Device:SpO2: (!) 82 % O2 Flow Rate: .O2 Flow Rate (L/min): 55 L/min  IO: Intake/output summary:   Intake/Output Summary (Last 24 hours) at 10/09/2020 1057 Last data filed at 10/09/2020 0800 Gross per 24 hour  Intake 267.58 ml  Output 1125 ml  Net -857.42 ml    LBM: Last BM Date: 09/27/2020 Baseline Weight: Weight: 86.2 kg Most recent weight: Weight: 86.2 kg     Palliative Assessment/Data:     Time In: 10:00 Time Out: 10:30 Time Total: 30 min Greater than 50%  of this time was spent counseling and coordinating care related to the above assessment and plan.  Signed by: Morton Stall, NP   Please contact Palliative Medicine Team phone at [phone] for questions and concerns.  For individual provider: See Loretha Stapler

## 2020-10-09 NOTE — Progress Notes (Signed)
NAME:  [name], MRN:  [id number], DOB:  1941-09-09, LOS: 1 ADMISSION DATE:  09/30/2020, CONSULTATION DATE:  10/25 REFERRING MD:  Dr Erma Heritage EDP, CHIEF COMPLAINT:  Hypoxemic respiratory failure   Brief History   79 year old male admitted with hypoxemic respiratory failure secondary to COVID 19 requiring BiPAP.    Past Medical History   has a past medical history of Chickenpox, GERD (gastroesophageal reflux disease), Hiatal hernia, and Prostate enlargement.  Significant Hospital Events   10/25 admit on BiPAP  Consults:    Procedures:  IMPRESSION: 1.  No evidence of deep venous thrombosis in either lower extremity.  2. 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 Vascular surgery consultation advised given this finding.  Significant Diagnostic Tests:    Micro Data:  COVID-19 10/25 > positive  Antimicrobials:  Ceftriaxone 10/25 > Azithromycin 10/25 >  Interim history/subjective:    Objective   Blood pressure 104/62, pulse (!) 58, temperature (!) 97.2 F (36.2 C), temperature source Axillary, resp. rate (!) 38, height 6\' 2"  (1.88 m), weight 86.2 kg, SpO2 (!) 77 %.    FiO2 (%):  [95 %] 95 %   Intake/Output Summary (Last 24 hours) at 10/09/2020 1700 Last data filed at 10/09/2020 1200 Gross per 24 hour  Intake 285.89 ml  Output 1250 ml  Net -964.11 ml   Filed Weights   10/10/2020 2228  Weight: 86.2 kg    Examination: General: Elderly male in mild distress on BiPAP HENT: Normocephalic, atraumatic, PERRL, no JVD Lungs: Coarse bilaterally, tachypneic to the mid 20s Cardiovascular: Regular rate and rhythm Abdomen: Protuberant, protruding umbilical hernia Extremities: No acute deformity, range of motion rotation, or edema Neuro: Alert, oriented, nonfocal   Resolved Hospital Problem list     Assessment & Plan:   Acute hypoxemic respiratory failure COVID-19 pneumonia Cannot rule out superimposed  bacterial pneumonia -NIV/BiPAP, or  HHFNC as tolerated  -He dismisses intubation, and wishes to drink  -Encourage prone positioning if he can tolerate -Empiric ceftriaxone, azithromycin - D/C remdesivir considering renal, hepatic dysfunction and limited evidence of benefit.   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 -Patient not stable enough for intervention at this time -Full dose Lovenox, monitor pulses  Acute kidney injury Chronic kidney disease -Gentle IV fluid resuscitation -Trend BMP, worsening creatinine   Elevated transaminases - Trend  Lactic acidosis: presumably r/t work of breathing - Trend down to 2.5  Disposition DNR/DNI Poor prognosis If there is stability with workable FiO2 </50% will consider evaluation of the pseudoaneurysm    Best practice:  Diet: NPO Pain/Anxiety/Delirium protocol (if indicated): NA VAP protocol (if indicated): NA DVT prophylaxis: SQH GI prophylaxis: PPI Glucose control: SSI Mobility: BR Code Status: DNR Family Communication: code status addressed by patient anddaughter in 03-28-1998. He tells me he would not want to go on a ventilator, which seems a reasonable choice given his age and COVID-19 severity.  DNR/DNI order placed  Disposition: ICU  Labs   CBC: Recent Labs  Lab 09/21/2020 2220 10/09/20 0331  WBC 4.8 6.6  NEUTROABS 3.9 5.7  HGB 15.2 13.6  HCT 46.6 42.4  MCV 88.4 90.2  PLT 144* 145*    Basic Metabolic Panel: Recent Labs  Lab 10/04/2020 2220 10/09/20 0331  NA 143 148*  K 4.2 4.1  CL 105 114*  CO2 19* 23  GLUCOSE 179* 168*  BUN  54* 41*  CREATININE 2.29* 1.42*  CALCIUM 9.3 8.8*  MG  --  2.7*  PHOS  --  2.9   GFR: Estimated Creatinine Clearance: 47.4 mL/min (A) (by C-G formula based on SCr of 1.42 mg/dL (H)). Recent Labs  Lab 10/26/2020 2220 10/26/2020 2222 10/26/20 2234 10/08/20 0033  10/09/20 0331  PROCALCITON  --   --  0.95  --   --   WBC 4.8  --   --   --  6.6  LATICACIDVEN  --  6.1*  --  2.5*  --     Liver Function Tests: Recent Labs  Lab 10-26-2020 2220 10/09/20 0331  AST 175* 91*  ALT 61* 43  ALKPHOS 83 73  BILITOT 2.6* 1.5*  PROT 8.3* 6.7  ALBUMIN 4.2 3.3*   No results for input(s): LIPASE, AMYLASE in the last 168 hours. Recent Labs  Lab 10/26/20 2220  AMMONIA 19    ABG    Component Value Date/Time   HCO3 22.4 2020/10/26 2216   TCO2 25 06/13/2011 1448   ACIDBASEDEF 2.1 (H) October 26, 2020 2216   O2SAT 11.0 10-26-2020 2216     Coagulation Profile: Recent Labs  Lab 26-Oct-2020 2220  INR 1.0    Cardiac Enzymes: No results for input(s): CKTOTAL, CKMB, CKMBINDEX, TROPONINI in the last 168 hours.  HbA1C: Hgb A1c MFr Bld  Date/Time Value Ref Range Status  05/11/2016 04:20 AM 6.2 (H) 4.8 - 5.6 % Final    Comment:    (NOTE)         Pre-diabetes: 5.7 - 6.4         Diabetes: >6.4         Glycemic control for adults with diabetes: <7.0     CBG: Recent Labs  Lab 10/08/20 0850  GLUCAP 158*    Review of Systems:   Bolds are positive  Constitutional: weight loss, gain, night sweats, Fevers, chills, fatigue .  HEENT: headaches, Sore throat, sneezing, nasal congestion, post nasal drip, Difficulty swallowing, Tooth/dental problems, visual complaints visual changes, ear ache CV:  chest pain, radiates:,Orthopnea, PND, swelling in lower extremities, dizziness, palpitations, syncope.  GI  heartburn, indigestion, abdominal pain, nausea, vomiting, diarrhea, change in bowel habits, loss of appetite, bloody stools.  Resp: cough, productive: "creamy white", hemoptysis, dyspnea, chest pain, pleuritic.  Skin: rash or itching or icterus GU: dysuria, change in color of urine, urgency or frequency. flank pain, hematuria  MS: joint pain or swelling. decreased range of motion  Psych: change in mood or affect. depression or anxiety.  Neuro: difficulty with  speech, weakness, numbness, ataxia    Past Medical History  He,  has a past medical history of Chickenpox, GERD (gastroesophageal reflux disease), Hiatal hernia, and Prostate enlargement.   Surgical History    Past Surgical History:  Procedure Laterality Date  . CATARACT EXTRACTION       Social History   reports that he has quit smoking. He has quit using smokeless tobacco. He reports that he does not drink alcohol and does not use drugs.   Family History   His family history includes Prostate cancer in his unknown relative.   Allergies Allergies  Allergen Reactions  . Tamsulosin Other (See Comments)    bleeding     Home Medications  Prior to Admission medications   Medication Sig Start Date End Date Taking? Authorizing Provider  albuterol (VENTOLIN HFA) 108 (90 Base) MCG/ACT inhaler Inhale 1 puff into the lungs every 6 (six) hours as needed for wheezing or shortness  of breath. 05/13/16   Richarda Overlie, MD  bismuth subsalicylate (PEPTO BISMOL) 262 MG/15ML suspension Take 30 mLs by mouth as needed for indigestion.    [provider]  cephALEXin (KEFLEX) 250 MG capsule Take 1 capsule (250 mg total) by mouth 4 (four) times daily. 06/16/18   Terrilee Files, MD  diltiazem (CARDIZEM CD) 120 MG 24 hr capsule Take 1 capsule (120 mg total) by mouth daily. Patient not taking: Reported on 06/15/2018 05/13/16   Richarda Overlie, MD  finasteride (PROSCAR) 5 MG tablet Take 5 mg by mouth daily. 07/08/20   [provider]  furosemide (LASIX) 20 MG tablet Take 1 tablet (20 mg total) by mouth daily. Patient not taking: Reported on 06/15/2018 05/13/16   Richarda Overlie, MD  guaiFENesin (MUCINEX) 600 MG 12 hr tablet Take 1 tablet (600 mg total) by mouth 2 (two) times daily. Patient not taking: Reported on 06/15/2018 05/13/16   Richarda Overlie, MD  ibuprofen (ADVIL,MOTRIN) 200 MG tablet Take 200 mg by mouth as needed for moderate pain.    [provider]  pantoprazole (PROTONIX) 40 MG  tablet Take 1 tablet (40 mg total) by mouth daily. Patient not taking: Reported on 06/15/2018 05/13/16   Richarda Overlie, MD  tamsulosin (FLOMAX) 0.4 MG CAPS capsule Take 1 capsule (0.4 mg total) by mouth daily. 06/09/18   Mesner, Barbara Cower, MD  thiamine (VITAMIN B-1) 100 MG tablet Take 100 mg by mouth daily.    [provider]  vitamin B-12 (CYANOCOBALAMIN) 1000 MCG tablet Take 1,000 mcg by mouth daily.    [provider]     Critical care time: 42 minutes     Patient seen and examined face to face fashion Images of the day and lab values reviewed Critical care time 12m  10/09/2020 5:00 PM

## 2020-10-10 ENCOUNTER — Inpatient Hospital Stay: Payer: Medicare Other

## 2020-10-10 DIAGNOSIS — U071 COVID-19: Secondary | ICD-10-CM

## 2020-10-10 LAB — CBC WITH DIFFERENTIAL/PLATELET
Abs Immature Granulocytes: 0.11 10*3/uL — ABNORMAL HIGH (ref 0.00–0.07)
Basophils Absolute: 0 10*3/uL (ref 0.0–0.1)
Basophils Relative: 0 %
Eosinophils Absolute: 0 10*3/uL (ref 0.0–0.5)
Eosinophils Relative: 0 %
HCT: 43.6 % (ref 39.0–52.0)
Hemoglobin: 14.4 g/dL (ref 13.0–17.0)
Immature Granulocytes: 1 %
Lymphocytes Relative: 3 %
Lymphs Abs: 0.2 10*3/uL — ABNORMAL LOW (ref 0.7–4.0)
MCH: 29.4 pg (ref 26.0–34.0)
MCHC: 33 g/dL (ref 30.0–36.0)
MCV: 89.2 fL (ref 80.0–100.0)
Monocytes Absolute: 0.5 10*3/uL (ref 0.1–1.0)
Monocytes Relative: 6 %
Neutro Abs: 6.9 10*3/uL (ref 1.7–7.7)
Neutrophils Relative %: 90 %
Platelets: 166 10*3/uL (ref 150–400)
RBC: 4.89 MIL/uL (ref 4.22–5.81)
RDW: 15.1 % (ref 11.5–15.5)
WBC: 7.7 10*3/uL (ref 4.0–10.5)
nRBC: 0 % (ref 0.0–0.2)

## 2020-10-10 LAB — COMPREHENSIVE METABOLIC PANEL
ALT: 51 U/L — ABNORMAL HIGH (ref 0–44)
AST: 87 U/L — ABNORMAL HIGH (ref 15–41)
Albumin: 3.3 g/dL — ABNORMAL LOW (ref 3.5–5.0)
Alkaline Phosphatase: 108 U/L (ref 38–126)
Anion gap: 11 (ref 5–15)
BUN: 44 mg/dL — ABNORMAL HIGH (ref 8–23)
CO2: 21 mmol/L — ABNORMAL LOW (ref 22–32)
Calcium: 9.2 mg/dL (ref 8.9–10.3)
Chloride: 116 mmol/L — ABNORMAL HIGH (ref 98–111)
Creatinine, Ser: 1.26 mg/dL — ABNORMAL HIGH (ref 0.61–1.24)
GFR, Estimated: 57 mL/min — ABNORMAL LOW (ref >60)
Glucose, Bld: 180 mg/dL — ABNORMAL HIGH (ref 70–99)
Potassium: 4.1 mmol/L (ref 3.5–5.1)
Sodium: 148 mmol/L — ABNORMAL HIGH (ref 135–145)
Total Bilirubin: 1.4 mg/dL — ABNORMAL HIGH (ref 0.3–1.2)
Total Protein: 6.7 g/dL (ref 6.5–8.1)

## 2020-10-10 LAB — FIBRIN DERIVATIVES D-DIMER (ARMC ONLY): Fibrin derivatives D-dimer (ARMC): 3135.99 ng/mL (FEU) — ABNORMAL HIGH (ref 0.00–499.00)

## 2020-10-10 LAB — GLUCOSE, CAPILLARY
Glucose-Capillary: 169 mg/dL — ABNORMAL HIGH (ref 70–99)
Glucose-Capillary: 261 mg/dL — ABNORMAL HIGH (ref 70–99)
Glucose-Capillary: 198 mg/dL — ABNORMAL HIGH (ref 70–99)
Glucose-Capillary: 187 mg/dL — ABNORMAL HIGH (ref 70–99)

## 2020-10-10 LAB — HEMOGLOBIN A1C
Hgb A1c MFr Bld: 6.3 % — ABNORMAL HIGH (ref 4.8–5.6)
Mean Plasma Glucose: 134 mg/dL

## 2020-10-10 LAB — C-REACTIVE PROTEIN: CRP: 1.2 mg/dL — ABNORMAL HIGH (ref <1.0)

## 2020-10-10 LAB — PHOSPHORUS: Phosphorus: 2.6 mg/dL (ref 2.5–4.6)

## 2020-10-10 LAB — MAGNESIUM: Magnesium: 3 mg/dL — ABNORMAL HIGH (ref 1.7–2.4)

## 2020-10-10 MED ORDER — POLYETHYLENE GLYCOL 3350 17 G PO PACK: 17.0000 g | PACK | Freq: Every day | ORAL | Status: DC | PRN

## 2020-10-10 MED ORDER — INSULIN ASPART 100 UNIT/ML ~~LOC~~ SOLN
0.0000 [IU] | Freq: Three times a day (TID) | SUBCUTANEOUS | Status: DC
  Administered 2020-10-10: 3 [IU] via SUBCUTANEOUS
  Administered 2020-10-10: 8 [IU] via SUBCUTANEOUS
  Administered 2020-10-10: 3 [IU] via SUBCUTANEOUS
  Filled 2020-10-10 (×3): qty 1

## 2020-10-10 MED ORDER — INSULIN ASPART 100 UNIT/ML ~~LOC~~ SOLN
0.0000 [IU] | Freq: Every day | SUBCUTANEOUS | Status: DC
  Filled 2020-10-10: qty 1

## 2020-10-10 MED ORDER — DEXMEDETOMIDINE HCL IN NACL 400 MCG/100ML IV SOLN
0.4000 ug/kg/h | INTRAVENOUS | Status: DC
  Administered 2020-10-10: 0.4 ug/kg/h via INTRAVENOUS
  Administered 2020-10-11: 0.8 ug/kg/h via INTRAVENOUS
  Filled 2020-10-10: qty 100

## 2020-10-10 NOTE — Plan of Care (Signed)
Discussed with patient plan of evening, pain management and eating with NRBM off and importance of placing back on when done with some teach back.  And updated daughter [name]

## 2020-10-10 NOTE — Progress Notes (Signed)
NAME:  [name], MRN:  [id number], DOB:  10-22-1941, LOS: 2 ADMISSION DATE:  09/25/2020, CONSULTATION DATE:  10/25 REFERRING MD:  Dr Erma Heritage EDP, CHIEF COMPLAINT:  Hypoxemic respiratory failure   Brief History   79 year old male admitted with hypoxemic respiratory failure secondary to COVID 19 requiring BiPAP.    Past Medical History   has a past medical history of Chickenpox, GERD (gastroesophageal reflux disease), Hiatal hernia, and Prostate enlargement.  Significant Hospital Events   10/25 admit on BiPAP  Consults:    Procedures:  IMPRESSION: 1.  No evidence of deep venous thrombosis in either lower extremity.  2. 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 Vascular surgery consultation advised given this finding.  Significant Diagnostic Tests:    Micro Data:  COVID-19 10/25 > positive  Antimicrobials:  Ceftriaxone 10/25 > Azithromycin 10/25 >  Interim history/subjective:    Objective   Blood pressure (!) 131/94, pulse 65, temperature (!) 97.3 F (36.3 C), temperature source Axillary, resp. rate (!) 28, height 6\' 2"  (1.88 m), weight 86.2 kg, SpO2 (!) 89 %.    FiO2 (%):  [94 %] 94 %   Intake/Output Summary (Last 24 hours) at 10/10/2020 1750 Last data filed at 10/10/2020 1600 Gross per 24 hour  Intake 350 ml  Output 1250 ml  Net -900 ml   Filed Weights   09/30/2020 2228  Weight: 86.2 kg    Examination: General: Elderly male in mild distress on BiPAP HENT: Normocephalic, atraumatic, PERRL, no JVD Lungs: Coarse bilaterally, tachypneic to the mid 20s Cardiovascular: Regular rate and rhythm Abdomen: Protuberant, protruding umbilical hernia Extremities: No acute deformity, range of motion rotation, or edema Neuro: Alert, oriented, nonfocal   Resolved Hospital Problem list     Assessment & Plan:   Acute hypoxemic respiratory failure COVID-19 pneumonia Cannot rule out superimposed  bacterial pneumonia -NIV/BiPAP, or  HHFNC as tolerated  -He dismisses intubation, and wishes to drink and eat -Encourage prone positioning if he can tolerate -Empiric ceftriaxone, azithromycin -Remains obstinate with NIV, but repeatedly has dismissed intubation  Elevated D-dimer -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 -Patient not stable enough for intervention at this time -Full dose Lovenox, monitor pulses  Acute kidney injury Chronic kidney disease -AKI improved creatinine 1.26 -Gentle IV fluid resuscitation -Trend BMP, worsening creatinine  -Correct lovenox   Elevated transaminases - Trend  Lactic acidosis: presumably r/t work of breathing - Trend down to 2.5  Disposition DNR/DNI Poor prognosis If there is stability with workable FiO2 </50% will consider evaluation of the pseudoaneurysm    Best practice:  Diet: NPO Pain/Anxiety/Delirium protocol (if indicated): NA VAP protocol (if indicated): NA DVT prophylaxis: SQH GI prophylaxis: PPI Glucose control: SSI Mobility: BR Code Status: DNR Family Communication: code status addressed by patient anddaughter in 03-28-1998. He tells me he would not want to go on a ventilator, which seems a reasonable choice given his age and COVID-19 severity.  DNR/DNI order placed  Disposition: ICU  Labs   CBC: Recent Labs  Lab 10/02/2020 2220 10/09/20 0331 10/10/20 0536  WBC 4.8 6.6 7.7  NEUTROABS 3.9 5.7 6.9  HGB 15.2 13.6 14.4  HCT 46.6 42.4 43.6  MCV 88.4 90.2 89.2  PLT 144* 145* 166    Basic Metabolic Panel: Recent Labs  Lab 09/29/2020 2220 10/09/20 0331 10/10/20 0536  NA 143 148* 148*  K 4.2 4.1 4.1  CL 105 114* 116*  CO2 19* 23  21*  GLUCOSE 179* 168* 180*  BUN 54* 41* 44*  CREATININE 2.29* 1.42* 1.26*  CALCIUM 9.3 8.8* 9.2  MG  --  2.7* 3.0*  PHOS  --  2.9 2.6   GFR: Estimated Creatinine Clearance: 53.5 mL/min (A) (by C-G formula based on SCr of 1.26 mg/dL (H)). Recent Labs  Lab  09/20/2020 2220 09/15/2020 2222 09/19/2020 2234 10/08/20 0033 10/09/20 0331 10/10/20 0536  PROCALCITON  --   --  0.95  --   --   --   WBC 4.8  --   --   --  6.6 7.7  LATICACIDVEN  --  6.1*  --  2.5*  --   --     Liver Function Tests: Recent Labs  Lab 10/06/2020 2220 10/09/20 0331 10/10/20 0536  AST 175* 91* 87*  ALT 61* 43 51*  ALKPHOS 83 73 108  BILITOT 2.6* 1.5* 1.4*  PROT 8.3* 6.7 6.7  ALBUMIN 4.2 3.3* 3.3*   No results for input(s): LIPASE, AMYLASE in the last 168 hours. Recent Labs  Lab 09/27/2020 2220  AMMONIA 19    ABG    Component Value Date/Time   HCO3 22.4 09/23/2020 2216   TCO2 25 06/13/2011 1448   ACIDBASEDEF 2.1 (H) 09/27/2020 2216   O2SAT 11.0 09/22/2020 2216     Coagulation Profile: Recent Labs  Lab 10/06/2020 2220  INR 1.0    Cardiac Enzymes: No results for input(s): CKTOTAL, CKMB, CKMBINDEX, TROPONINI in the last 168 hours.  HbA1C: Hgb A1c MFr Bld  Date/Time Value Ref Range Status  05/11/2016 04:20 AM 6.2 (H) 4.8 - 5.6 % Final    Comment:    (NOTE)         Pre-diabetes: 5.7 - 6.4         Diabetes: >6.4         Glycemic control for adults with diabetes: <7.0     CBG: Recent Labs  Lab 10/08/20 0850 10/10/20 0739 10/10/20 1127 10/10/20 1636  GLUCAP 158* 169* 261* 198*    Review of Systems:   Bolds are positive  Constitutional: weight loss, gain, night sweats, Fevers, chills, fatigue .  HEENT: headaches, Sore throat, sneezing, nasal congestion, post nasal drip, Difficulty swallowing, Tooth/dental problems, visual complaints visual changes, ear ache CV:  chest pain, radiates:,Orthopnea, PND, swelling in lower extremities, dizziness, palpitations, syncope.  GI  heartburn, indigestion, abdominal pain, nausea, vomiting, diarrhea, change in bowel habits, loss of appetite, bloody stools.  Resp: cough, productive: "creamy white", hemoptysis, dyspnea, chest pain, pleuritic.  Skin: rash or itching or icterus GU: dysuria, change in color of  urine, urgency or frequency. flank pain, hematuria  MS: joint pain or swelling. decreased range of motion  Psych: change in mood or affect. depression or anxiety.  Neuro: difficulty with speech, weakness, numbness, ataxia    Past Medical History  He,  has a past medical history of Chickenpox, GERD (gastroesophageal reflux disease), Hiatal hernia, and Prostate enlargement.   Surgical History    Past Surgical History:  Procedure Laterality Date  . CATARACT EXTRACTION       Social History   reports that he has quit smoking. He has quit using smokeless tobacco. He reports that he does not drink alcohol and does not use drugs.   Family History   His family history includes Prostate cancer in his unknown relative.   Allergies Allergies  Allergen Reactions  . Tamsulosin Other (See Comments)    bleeding     Home Medications  Prior  to Admission medications   Medication Sig Start Date End Date Taking? Authorizing Provider  albuterol (VENTOLIN HFA) 108 (90 Base) MCG/ACT inhaler Inhale 1 puff into the lungs every 6 (six) hours as needed for wheezing or shortness of breath. 05/13/16   Richarda Overlie, MD  bismuth subsalicylate (PEPTO BISMOL) 262 MG/15ML suspension Take 30 mLs by mouth as needed for indigestion.    [provider]  cephALEXin (KEFLEX) 250 MG capsule Take 1 capsule (250 mg total) by mouth 4 (four) times daily. 06/16/18   Terrilee Files, MD  diltiazem (CARDIZEM CD) 120 MG 24 hr capsule Take 1 capsule (120 mg total) by mouth daily. Patient not taking: Reported on 06/15/2018 05/13/16   Richarda Overlie, MD  finasteride (PROSCAR) 5 MG tablet Take 5 mg by mouth daily. 07/08/20   [provider]  furosemide (LASIX) 20 MG tablet Take 1 tablet (20 mg total) by mouth daily. Patient not taking: Reported on 06/15/2018 05/13/16   Richarda Overlie, MD  guaiFENesin (MUCINEX) 600 MG 12 hr tablet Take 1 tablet (600 mg total) by mouth 2 (two) times daily. Patient not taking: Reported on  06/15/2018 05/13/16   Richarda Overlie, MD  ibuprofen (ADVIL,MOTRIN) 200 MG tablet Take 200 mg by mouth as needed for moderate pain.    [provider]  pantoprazole (PROTONIX) 40 MG tablet Take 1 tablet (40 mg total) by mouth daily. Patient not taking: Reported on 06/15/2018 05/13/16   Richarda Overlie, MD  tamsulosin (FLOMAX) 0.4 MG CAPS capsule Take 1 capsule (0.4 mg total) by mouth daily. 06/09/18   Mesner, Barbara Cower, MD  thiamine (VITAMIN B-1) 100 MG tablet Take 100 mg by mouth daily.    [provider]  vitamin B-12 (CYANOCOBALAMIN) 1000 MCG tablet Take 1,000 mcg by mouth daily.    [provider]     Critical care time: 42 minutes     Patient seen and examined face to face fashion Images of the day and lab values reviewed Critical care time 51m  10/10/2020 5:50 PM

## 2020-10-10 NOTE — Progress Notes (Signed)
Frequent camera checks with the patient, and have had several conversations with Heron Sabins at bedside. Pt continues to desaturate when active or when oxygen pulled off. Difficulty redirecting patient through camera. Pt refuses BiPAP or intubation. Appears calm on camera checks. Will continue to observe on rounds.

## 2020-10-11 ENCOUNTER — Inpatient Hospital Stay: Payer: Medicare Other

## 2020-10-11 LAB — COMPREHENSIVE METABOLIC PANEL
ALT: 55 U/L — ABNORMAL HIGH (ref 0–44)
AST: 77 U/L — ABNORMAL HIGH (ref 15–41)
Albumin: 3.1 g/dL — ABNORMAL LOW (ref 3.5–5.0)
Alkaline Phosphatase: 137 U/L — ABNORMAL HIGH (ref 38–126)
Anion gap: 15 (ref 5–15)
BUN: 42 mg/dL — ABNORMAL HIGH (ref 8–23)
CO2: 17 mmol/L — ABNORMAL LOW (ref 22–32)
Calcium: 9 mg/dL (ref 8.9–10.3)
Chloride: 117 mmol/L — ABNORMAL HIGH (ref 98–111)
Creatinine, Ser: 1.51 mg/dL — ABNORMAL HIGH (ref 0.61–1.24)
GFR, Estimated: 46 mL/min — ABNORMAL LOW (ref >60)
Glucose, Bld: 271 mg/dL — ABNORMAL HIGH (ref 70–99)
Potassium: 4 mmol/L (ref 3.5–5.1)
Sodium: 149 mmol/L — ABNORMAL HIGH (ref 135–145)
Total Bilirubin: 1.4 mg/dL — ABNORMAL HIGH (ref 0.3–1.2)
Total Protein: 6.5 g/dL (ref 6.5–8.1)

## 2020-10-11 LAB — CBC WITH DIFFERENTIAL/PLATELET
Abs Immature Granulocytes: 0.18 10*3/uL — ABNORMAL HIGH (ref 0.00–0.07)
Basophils Absolute: 0 10*3/uL (ref 0.0–0.1)
Basophils Relative: 0 %
Eosinophils Absolute: 0 10*3/uL (ref 0.0–0.5)
Eosinophils Relative: 0 %
HCT: 44 % (ref 39.0–52.0)
Hemoglobin: 14.3 g/dL (ref 13.0–17.0)
Immature Granulocytes: 2 %
Lymphocytes Relative: 3 %
Lymphs Abs: 0.2 10*3/uL — ABNORMAL LOW (ref 0.7–4.0)
MCH: 29.4 pg (ref 26.0–34.0)
MCHC: 32.5 g/dL (ref 30.0–36.0)
MCV: 90.3 fL (ref 80.0–100.0)
Monocytes Absolute: 0.4 10*3/uL (ref 0.1–1.0)
Monocytes Relative: 5 %
Neutro Abs: 7.3 10*3/uL (ref 1.7–7.7)
Neutrophils Relative %: 90 %
Platelets: 178 10*3/uL (ref 150–400)
RBC: 4.87 MIL/uL (ref 4.22–5.81)
RDW: 15 % (ref 11.5–15.5)
WBC: 8.1 10*3/uL (ref 4.0–10.5)
nRBC: 0.2 % (ref 0.0–0.2)

## 2020-10-11 LAB — PHOSPHORUS: Phosphorus: 4.8 mg/dL — ABNORMAL HIGH (ref 2.5–4.6)

## 2020-10-11 LAB — FIBRIN DERIVATIVES D-DIMER (ARMC ONLY): Fibrin derivatives D-dimer (ARMC): 2869.55 ng/mL (FEU) — ABNORMAL HIGH (ref 0.00–499.00)

## 2020-10-11 LAB — MAGNESIUM: Magnesium: 3 mg/dL — ABNORMAL HIGH (ref 1.7–2.4)

## 2020-10-11 LAB — C-REACTIVE PROTEIN: CRP: 1 mg/dL — ABNORMAL HIGH (ref <1.0)

## 2020-10-11 MED ORDER — AMIODARONE IV BOLUS ONLY 150 MG/100ML: 150.0000 mg | Freq: Once | INTRAVENOUS | Status: AC

## 2020-10-11 MED ORDER — MORPHINE 100MG IN NS 100ML (1MG/ML) PREMIX INFUSION
1.0000 mg/h | INTRAVENOUS | Status: DC
  Administered 2020-10-11: 2 mg/h via INTRAVENOUS
  Filled 2020-10-11: qty 100

## 2020-10-11 MED ORDER — HALOPERIDOL LACTATE 5 MG/ML IJ SOLN
2.0000 mg | Freq: Once | INTRAMUSCULAR | Status: AC
  Administered 2020-10-11: 2 mg via INTRAVENOUS

## 2020-10-11 MED ORDER — LORAZEPAM 2 MG/ML IJ SOLN
2.0000 mg | INTRAMUSCULAR | Status: DC | PRN
  Administered 2020-10-11: 2 mg via INTRAVENOUS
  Filled 2020-10-11: qty 1

## 2020-10-11 MED ORDER — AMIODARONE IV BOLUS ONLY 150 MG/100ML
INTRAVENOUS | Status: AC
  Administered 2020-10-11: 150 mg
  Filled 2020-10-11: qty 100

## 2020-10-11 MED ORDER — MORPHINE SULFATE (PF) 2 MG/ML IV SOLN
2.0000 mg | INTRAVENOUS | Status: DC | PRN
  Administered 2020-10-11: 2 mg via INTRAVENOUS
  Filled 2020-10-11: qty 1

## 2020-10-11 MED ORDER — MORPHINE SULFATE (PF) 2 MG/ML IV SOLN
2.0000 mg | Freq: Once | INTRAVENOUS | Status: AC
  Administered 2020-10-11: 2 mg via INTRAVENOUS
  Filled 2020-10-11: qty 1

## 2020-10-11 MED ORDER — HALOPERIDOL LACTATE 5 MG/ML IJ SOLN: 2.0000 mg | Freq: Four times a day (QID) | INTRAMUSCULAR | Status: DC | PRN

## 2020-10-11 MED ORDER — HALOPERIDOL LACTATE 5 MG/ML IJ SOLN
INTRAMUSCULAR | Status: AC
  Filled 2020-10-11: qty 1

## 2020-10-12 LAB — CULTURE, BLOOD (ROUTINE X 2)
Culture: NO GROWTH
Culture: NO GROWTH

## 2020-10-14 NOTE — Progress Notes (Signed)
Pt continues to decline with worsening Hypoxia (O2 sats in the 70's), increased work of breathing, along with worsening Encephalopathy and confusion.    Pt has refused BiPAP, and is now requiring Precedex and prn Haldol due to noncompliance and agitation with medical treatment.  CXR this morning shows unchanged pneumonia.  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  Review of chart shows that pt has made it well known that he would not want to be intubated or undergo resuscitation, and has made himself DNR/DNI.  Given pt's decline, we are nearing END OF LIFE.  In respecting pt's wishes, recommend transitioning to Comfort Measures in the near future.  Palliative Care is following along.  Have attempted to contact pt's daughter Kasem Mozer to update her on her father's decline.  Unable to reach her, have left voice mail requesting call back.   Pt's prognosis is poor and extremely guarded.  High risk for cardiac arrest and death.  Recommend Comfort Measures.     [name], AGACNP-BC [facility] Pager: [phone]

## 2020-10-14 NOTE — Progress Notes (Signed)
Tried call daughter at same number reached at last night.

## 2020-10-14 NOTE — Progress Notes (Signed)
Patient has had increased confusion, work of breathing and requested a few interventions from the night NP.  Patient has had to have safety mitts on to stop pulling off oxygen abnd desaturating to the 50%.

## 2020-10-14 NOTE — Progress Notes (Signed)
[facility] notified of change in plan of care. Referral # [id number]. Call back with TOD.

## 2020-10-14 NOTE — Death Summary Note (Signed)
At 1005 pt. Was pronounced deceased. Hr was verified by Hoyle Sauer and this nurse.  Family requested to be notified as they were in the waiting room.  All belongings were transferred into the possession of the family at that time.

## 2020-10-14 NOTE — Progress Notes (Signed)
   Subjective:    Patient ID: [name], male    DOB: 1941-04-03, 79 y.o.   MRN: [id number]  Shortness of Breath      Review of Systems  Respiratory: Positive for shortness of breath.        Objective:   Physical Exam        Assessment & Plan:

## 2020-10-14 NOTE — Progress Notes (Signed)
Was able to speak with pt's daughter [name] regarding [name] decline and that he is suffering and actively dying.  She has elected to transition him to COMFORT CARE MEASURES ONLY.  Orders placed for morphine gtt and medications for comfort. Have urged them to come to bedside as soon as possible, and they state they will be here within the hour.  DNR/DNI Nurse may pronounce     [name], AGACNP-BC [facility] Pager: [phone]

## 2020-10-14 DEATH — deceased

## 2021-02-10 IMAGING — DX DG CHEST 1V PORT
1 series · 1 of 1 positions shown · non-contrast
Comparison: 10/10/2020 at [DATE] a.m.

CLINICAL DATA: Respiratory failure, [id number]

EXAM:
PORTABLE CHEST 1 VIEW

[chest ap]
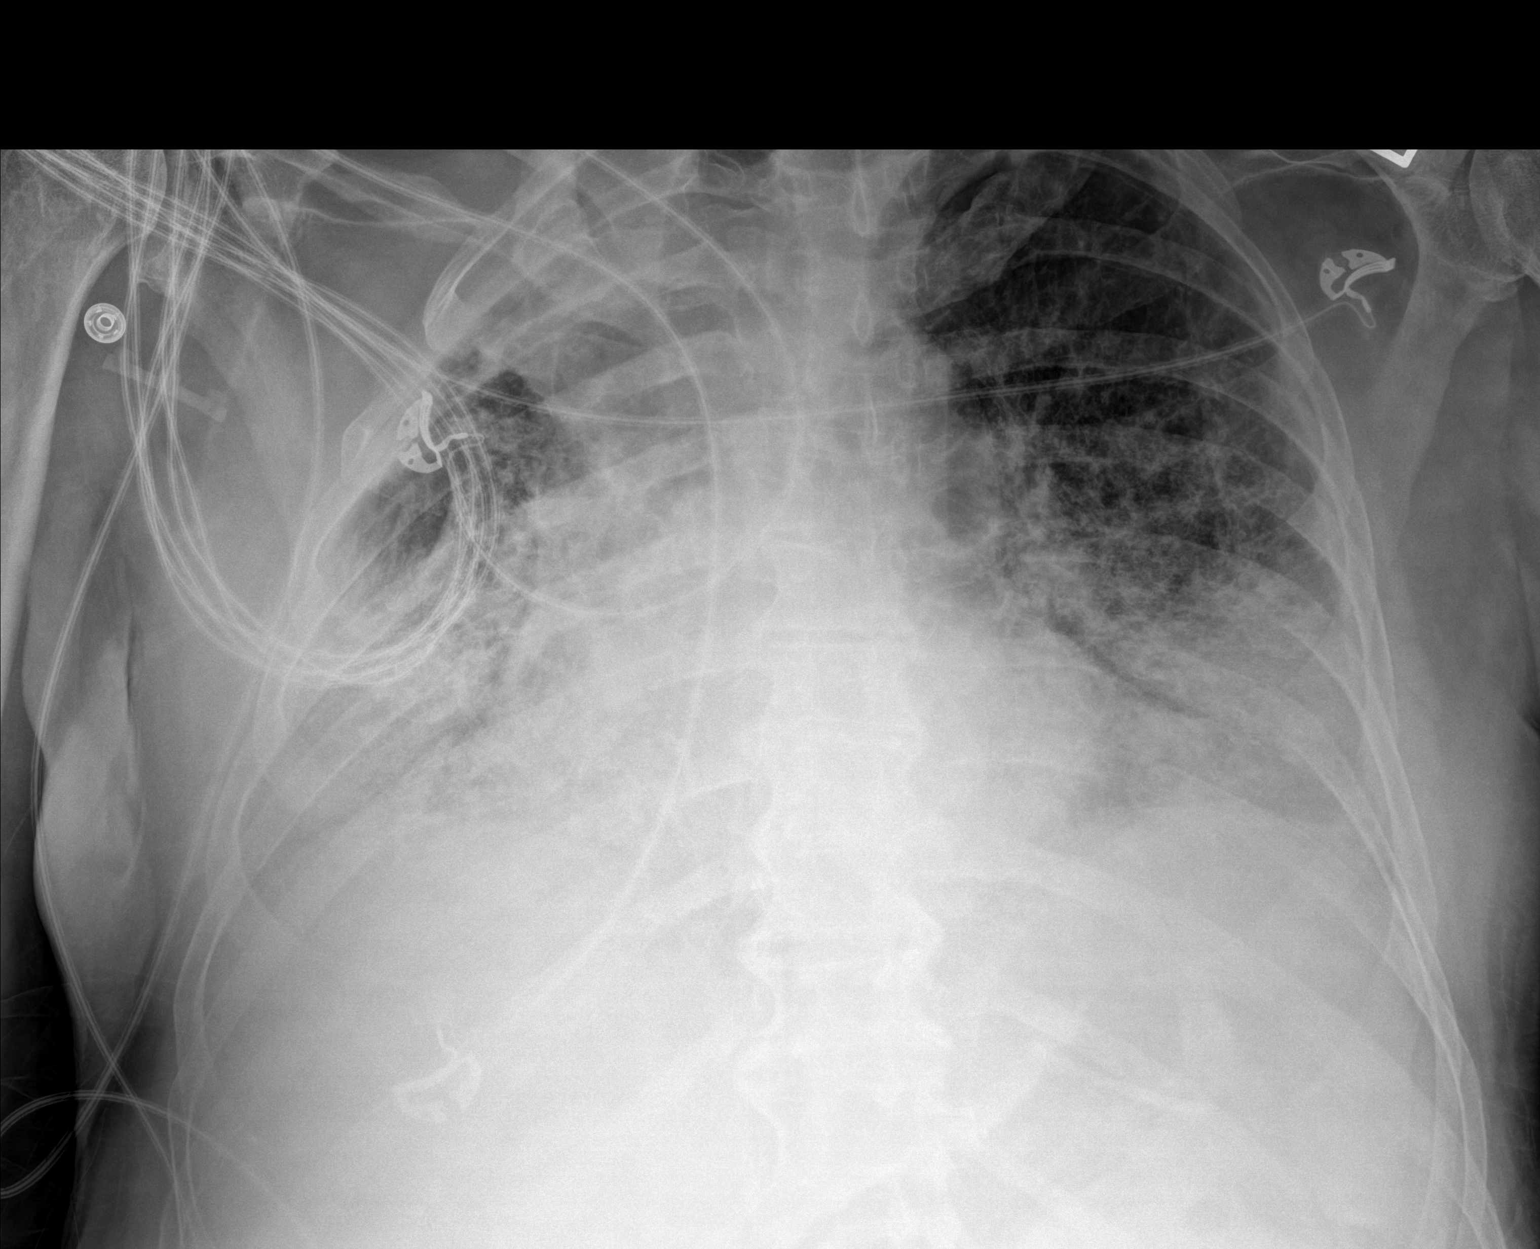

[1 of 1 positions shown; findings below may reference images not displayed]

FINDINGS: Unchanged bilateral airspace opacities. Normal cardiomediastinal
contours. Shallow lung inflation.
IMPRESSION: Unchanged bilateral pneumonia.
